# Patient Record
Sex: Female | Born: 2000 | Race: White | Hispanic: No | Marital: Single | Smoking: Current some day smoker
Health system: Southern US, Community
[De-identification: ages and names within clinical notes are randomized; demographics above are authoritative.]

## PROBLEM LIST (undated history)

## (undated) DIAGNOSIS — F1910 Other psychoactive substance abuse, uncomplicated: Secondary | ICD-10-CM

## (undated) DIAGNOSIS — J45909 Unspecified asthma, uncomplicated: Secondary | ICD-10-CM

## (undated) DIAGNOSIS — F603 Borderline personality disorder: Secondary | ICD-10-CM

---
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
## 2018-01-08 DIAGNOSIS — L700 Acne vulgaris: Secondary | ICD-10-CM | POA: Diagnosis not present

## 2018-01-11 DIAGNOSIS — X58XXXA Exposure to other specified factors, initial encounter: Secondary | ICD-10-CM | POA: Diagnosis not present

## 2018-01-11 DIAGNOSIS — S00551A Superficial foreign body of lip, initial encounter: Secondary | ICD-10-CM | POA: Diagnosis not present

## 2018-01-11 DIAGNOSIS — R220 Localized swelling, mass and lump, head: Secondary | ICD-10-CM | POA: Diagnosis not present

## 2018-01-11 DIAGNOSIS — Z23 Encounter for immunization: Secondary | ICD-10-CM | POA: Diagnosis not present

## 2018-01-29 MED FILL — PRAZOSIN 1 MG CAPSULE: 1 | 30 days supply | Qty: 30 | Fill #1

## 2018-02-24 MED FILL — traZODone HCL 50 MG TABS: 50 | 90 days supply | Qty: 90 | Fill #0

## 2018-03-06 DIAGNOSIS — J029 Acute pharyngitis, unspecified: Secondary | ICD-10-CM | POA: Diagnosis not present

## 2018-03-13 MED FILL — PRAZOSIN 1 MG CAPSULE: 1 | 30 days supply | Qty: 30 | Fill #2

## 2018-04-01 ENCOUNTER — Ambulatory Visit (HOSPITAL_COMMUNITY): Admission: RE | Admit: 2018-04-01 | Payer: Self-pay | Source: Home / Self Care | Admitting: Psychiatry

## 2018-04-01 ENCOUNTER — Inpatient Hospital Stay (HOSPITAL_COMMUNITY)
Admission: RE | Admit: 2018-04-01 | Discharge: 2018-04-04 | DRG: 885 | Disposition: A | Discharge status: Home / Self Care | Payer: No Typology Code available for payment source | Attending: Psychiatry | Admitting: Psychiatry

## 2018-04-01 ENCOUNTER — Encounter (HOSPITAL_COMMUNITY): Payer: Self-pay | Admitting: *Deleted

## 2018-04-01 ENCOUNTER — Other Ambulatory Visit: Payer: Self-pay

## 2018-04-01 DIAGNOSIS — J45909 Unspecified asthma, uncomplicated: Secondary | ICD-10-CM | POA: Diagnosis present

## 2018-04-01 DIAGNOSIS — F332 Major depressive disorder, recurrent severe without psychotic features: Secondary | ICD-10-CM | POA: Diagnosis not present

## 2018-04-01 DIAGNOSIS — F649 Gender identity disorder, unspecified: Secondary | ICD-10-CM | POA: Diagnosis present

## 2018-04-01 DIAGNOSIS — F1290 Cannabis use, unspecified, uncomplicated: Secondary | ICD-10-CM | POA: Diagnosis present

## 2018-04-01 DIAGNOSIS — G4700 Insomnia, unspecified: Secondary | ICD-10-CM | POA: Diagnosis present

## 2018-04-01 DIAGNOSIS — F17210 Nicotine dependence, cigarettes, uncomplicated: Secondary | ICD-10-CM

## 2018-04-01 DIAGNOSIS — Z23 Encounter for immunization: Secondary | ICD-10-CM | POA: Diagnosis not present

## 2018-04-01 DIAGNOSIS — Z62810 Personal history of physical and sexual abuse in childhood: Secondary | ICD-10-CM

## 2018-04-01 DIAGNOSIS — X789XXA Intentional self-harm by unspecified sharp object, initial encounter: Secondary | ICD-10-CM

## 2018-04-01 DIAGNOSIS — R45851 Suicidal ideations: Secondary | ICD-10-CM | POA: Diagnosis not present

## 2018-04-01 DIAGNOSIS — Z915 Personal history of self-harm: Secondary | ICD-10-CM | POA: Diagnosis not present

## 2018-04-01 HISTORY — DX: Unspecified asthma, uncomplicated: J45.909

## 2018-04-01 LAB — RAPID URINE DRUG SCREEN, HOSP PERFORMED
Amphetamines: NOT DETECTED
Barbiturates: NOT DETECTED
Benzodiazepines: NOT DETECTED
Cocaine: NOT DETECTED
Opiates: NOT DETECTED
Tetrahydrocannabinol: POSITIVE — AB

## 2018-04-01 LAB — PREGNANCY, URINE: Preg Test, Ur: NEGATIVE

## 2018-04-01 MED ORDER — ENSURE ENLIVE PO LIQD: 237.0000 mL | Freq: Every day | ORAL | Status: DC | PRN

## 2018-04-01 MED ORDER — ZIPRASIDONE HCL 40 MG PO CAPS
40.0000 mg | ORAL_CAPSULE | Freq: Two times a day (BID) | ORAL | Status: DC
  Administered 2018-04-01 – 2018-04-04 (×6): 40 mg via ORAL
  Filled 2018-04-01 (×6): qty 1
  Filled 2018-04-01: qty 2
  Filled 2018-04-01 (×4): qty 1

## 2018-04-01 MED ORDER — HYDROXYZINE HCL 25 MG PO TABS
25.0000 mg | ORAL_TABLET | Freq: Three times a day (TID) | ORAL | Status: DC | PRN
  Administered 2018-04-01 – 2018-04-03 (×4): 25 mg via ORAL
  Filled 2018-04-01 (×4): qty 1

## 2018-04-01 MED ORDER — TRAZODONE HCL 50 MG PO TABS: 50.0000 mg | ORAL_TABLET | Freq: Every evening | ORAL | Status: DC | PRN

## 2018-04-01 MED ORDER — PRAZOSIN HCL 1 MG PO CAPS
1.0000 mg | ORAL_CAPSULE | Freq: Every day | ORAL | Status: DC
  Administered 2018-04-01 – 2018-04-03 (×3): 1 mg via ORAL
  Filled 2018-04-01 (×6): qty 1

## 2018-04-01 MED ORDER — ADULT MULTIVITAMIN W/MINERALS CH
1.0000 | ORAL_TABLET | Freq: Every day | ORAL | Status: DC
  Administered 2018-04-01 – 2018-04-04 (×4): 1 via ORAL
  Filled 2018-04-01 (×7): qty 1

## 2018-04-01 MED ORDER — PAROXETINE HCL 20 MG PO TABS
20.0000 mg | ORAL_TABLET | Freq: Every day | ORAL | Status: DC
  Administered 2018-04-02 – 2018-04-04 (×3): 20 mg via ORAL
  Filled 2018-04-01 (×5): qty 1

## 2018-04-01 MED ORDER — NICOTINE 7 MG/24HR TD PT24
7.0000 mg | MEDICATED_PATCH | Freq: Every day | TRANSDERMAL | Status: DC
  Administered 2018-04-01 – 2018-04-04 (×4): 7 mg via TRANSDERMAL
  Filled 2018-04-01 (×6): qty 1

## 2018-04-01 MED ORDER — ENSURE ENLIVE PO LIQD: 237.0000 mL | Freq: Two times a day (BID) | ORAL | Status: DC

## 2018-04-01 MED ORDER — ALUM & MAG HYDROXIDE-SIMETH 200-200-20 MG/5ML PO SUSP: 30.0000 mL | ORAL | Status: DC | PRN

## 2018-04-01 MED ORDER — INFLUENZA VAC SPLIT QUAD 0.5 ML IM SUSY
0.5000 mL | PREFILLED_SYRINGE | INTRAMUSCULAR | Status: AC
  Administered 2018-04-02: 0.5 mL via INTRAMUSCULAR
  Filled 2018-04-01: qty 0.5

## 2018-04-01 MED ORDER — MAGNESIUM HYDROXIDE 400 MG/5ML PO SUSP: 30.0000 mL | Freq: Every day | ORAL | Status: DC | PRN

## 2018-04-01 MED ORDER — ACETAMINOPHEN 325 MG PO TABS
650.0000 mg | ORAL_TABLET | Freq: Four times a day (QID) | ORAL | Status: DC | PRN
  Administered 2018-04-01: 650 mg via ORAL
  Filled 2018-04-01: qty 2

## 2018-04-01 NOTE — H&P (Signed)
Behavioral Health Medical Screening Exam  Kristina Sawyer is an 17 y.o. female.  Total Time spent with patient: 20 minutes  Psychiatric Specialty Exam: Physical Exam  Constitutional: She is oriented to person, place, and time. She appears well-developed and well-nourished. No distress.  HENT:  Head: Normocephalic and atraumatic.  Right Ear: External ear normal.  Left Ear: External ear normal.  Eyes: Right eye exhibits no discharge. Left eye exhibits no discharge.  Respiratory: Effort normal. No respiratory distress.  Musculoskeletal: Normal range of motion.  Neurological: She is alert and oriented to person, place, and time.  Skin: Skin is warm and dry. She is not diaphoretic.       Review of Systems  Constitutional: Negative for chills, fever and weight loss.  Psychiatric/Behavioral: Positive for depression, hallucinations and suicidal ideas. Negative for memory loss and substance abuse. The patient is nervous/anxious and has insomnia.   All other systems reviewed and are negative.   There were no vitals taken for this visit.There is no height or weight on file to calculate BMI.  General Appearance: Casual and Well Groomed  Eye Contact:  Fair  Speech:  Clear and Coherent and Normal Rate  Volume:  Normal  Mood:  Anxious, Depressed, Dysphoric, Hopeless and Worthless  Affect:  Congruent and Depressed  Thought Process:  Coherent, Goal Directed and Descriptions of Associations: Intact  Orientation:  Full (Time, Place, and Person)  Thought Content:  Logical and Hallucinations: None  Suicidal Thoughts:  Yes.  with intent/plan  Homicidal Thoughts:  Yes.  without intent/plan  Memory:  Immediate;   Good Recent;   Fair  Judgement:  Impaired  Insight:  Fair  Psychomotor Activity:  Normal  Concentration: Concentration: Fair and Attention Span: Fair  Recall:  Good  Fund of Knowledge:Good  Language: Good  Akathisia:  No  Handed:  Right  AIMS (if indicated):     Assets:  Communication  Skills Desire for Improvement Financial Resources/Insurance Housing Intimacy Leisure Time Physical Health Transportation  Sleep:       Musculoskeletal: Strength & Muscle Tone: within normal limits Gait & Station: normal    Recommendations:  Based on my evaluation the patient does not appear to have an emergency medical condition.  Jackelyn PolingJason A Samaad Hashem, NP 04/01/2018, 2:39 AM
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
## 2018-04-01 NOTE — Plan of Care (Signed)
  Problem: Education: Goal: Emotional status will improve Outcome: Not Progressing   Problem: Activity: Goal: Interest or engagement in activities will improve Outcome: Not Progressing   Problem: Safety: Goal: Periods of time without injury will increase Outcome: Progressing  DAR NOTE: Patient presents with flat affect and depressed mood.  Denies suicidal thoughts, pain, auditory and visual hallucinations.  Described energy level as low and concentration as poor.  Rates depression at 0, hopelessness at 0, and anxiety at 0.  Maintained on routine safety checks.  Medications given as prescribed.  Support and encouragement offered as needed.  Attended group and participated.  Patient is withdrawn and isolates to her room for most of this shift.  Offered no complaint.

## 2018-04-01 NOTE — Progress Notes (Signed)
Adult Psychoeducational Group Note  Date:  04/01/2018 Time:  9:37 PM  Group Topic/Focus:  Wrap-Up Group:   The focus of this group is to help patients review their daily goal of treatment and discuss progress on daily workbooks.  Participation Level:  Did Not Attend  Participation Quality:  Did not attend  Affect:  Did not attend  Cognitive:  Did not attend  Insight: None  Engagement in Group:  Did not attend  Modes of Intervention:  Did not attend  Additional Comments:  Pt did not attend evening wrap up group tonight.  Kristina FurnaceChristopher  Lemond Sawyer 04/01/2018, 9:37 PM
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
## 2018-04-01 NOTE — Tx Team (Signed)
Initial Treatment Plan 04/01/2018 4:06 AM Kristina Sawyer [id number]    PATIENT STRESSORS: Marital or family conflict Traumatic event   PATIENT STRENGTHS: Active sense of humor Average or above average intelligence Communication skills General fund of knowledge Physical Health Supportive family/friends   PATIENT IDENTIFIED PROBLEMS: "be happier"  "develop healthier coping skills"    SI   depression             DISCHARGE CRITERIA:  Improved stabilization in mood, thinking, and/or behavior Need for constant or close observation no longer present  PRELIMINARY DISCHARGE PLAN: Outpatient therapy  PATIENT/FAMILY INVOLVEMENT: This treatment plan has been presented to and reviewed with the patient, Kristina Sawyer.  The patient and family have been given the opportunity to ask questions and make suggestions.  Arrie AranChurch, Librado Guandique J, [place]RN 04/01/2018, 4:06 AM

## 2018-04-01 NOTE — [facility] Suicide Risk Assessment (Signed)
[facility]   Nursing information obtained from:  Patient Demographic factors:  Gay, lesbian, or bisexual orientation Current Mental Status:  Self-harm behaviors Loss Factors:  NA Historical Factors:  Prior suicide attempts, Impulsivity, Family history of mental illness or substance abuse, Family history of suicide Risk Reduction Factors:  Religious beliefs about death, Sense of responsibility to family, Positive coping skills or problem solving skills, Living with another person, especially a relative  Total Time spent with patient: 45 minutes Principal Problem:  MDD Diagnosis:   Patient Active Problem List   Diagnosis Date Noted  . Severe recurrent major depression without psychotic features (HCC) [F33.2] 04/01/2018  . Borderline personality disorder (HCC) [F60.3]   . MDD (major depressive disorder), recurrent episode, severe (HCC) [F33.2] 10/07/2017  . MDD (major depressive disorder), severe (HCC) [F32.2] 05/21/2017  . MDD (major depressive disorder) [F32.9] 05/20/2017  . Depressed [F32.9] 02/15/2014  . Suicide attempt by drug ingestion (HCC) [T50.902A] 02/09/2014  . Depression [F32.9] 02/09/2014  . Anxiety state, unspecified [F41.1] 02/09/2014   Subjective Data:   Continued Clinical Symptoms:  Alcohol Use Disorder Identification Test Final Score (AUDIT): 2 The "Alcohol Use Disorders Identification Test", Guidelines for Use in Primary Care, Second Edition.  World Science writerHealth Organization [facility]i(WHO). Score between 0-7:  no or low risk or alcohol related problems. Score between 8-15:  moderate risk of alcohol related problems. Score between 16-19:  high risk of alcohol related problems. Score 20 or above:  warrants further diagnostic evaluation for alcohol dependence and treatment.   CLINICAL FACTORS:  17 year old , transgender female, reports worsening depression, anxiety, suicidal ideations, recent episode of self cutting .      Psychiatric Specialty  Exam: Physical Exam  ROS  Blood pressure 109/90, pulse 100, temperature 98.2 F (36.8 C), temperature source Oral, resp. rate 16, height 5\' 4"  (1.626 m), weight 80.7 kg, last menstrual period 03/01/2018.Body mass index is 30.55 kg/m.  See admit note MSE   COGNITIVE FEATURES THAT CONTRIBUTE TO RISK:  Closed-mindedness and Loss of executive function    SUICIDE RISK:   Moderate:  Frequent suicidal ideation with limited intensity, and duration, some specificity in terms of plans, no associated intent, good self-control, limited dysphoria/symptomatology, some risk factors present, and identifiable protective factors, including available and accessible social support.  PLAN OF CARE: Patient will be admitted to inpatient psychiatric unit for stabilization and safety. Will provide and encourage milieu participation. Provide medication management and maked adjustments as needed.  Will follow daily.    I certify that inpatient services furnished can reasonably be expected to improve the patient's condition.   Craige CottaFernando A Cobos, MD 04/01/2018, 3:07 PM

## 2018-04-01 NOTE — [facility] Group Notes (Signed)
LCSW Group Therapy Note 04/01/2018 12:07 PM  Type of Therapy/Topic: Group Therapy: Feelings about Diagnosis  Participation Level: Active   Description of Group:  This group will allow patients to explore their thoughts and feelings about diagnoses they have received. Patients will be guided to explore their level of understanding and acceptance of these diagnoses. Facilitator will encourage patients to process their thoughts and feelings about the reactions of others to their diagnosis and will guide patients in identifying ways to discuss their diagnosis with significant others in their lives. This group will be process-oriented, with patients participating in exploration of their own experiences, giving and receiving support, and processing challenge from other group members.  Therapeutic Goals: 1. Patient will demonstrate understanding of diagnosis as evidenced by identifying two or more symptoms of the disorder 2. Patient will be able to express two feelings regarding the diagnosis 3. Patient will demonstrate their ability to communicate their needs through discussion and/or role play  Summary of Patient Progress:  Kristina Sawyer was engaged and participated throughout the group session. Kristina Sawyer reports that having a mental health diagnosis is "frustrating". Kristina Sawyer states that even though she may comply with her medications and engage in treatment, she still suffers from symptoms.     Therapeutic Modalities:  Cognitive Behavioral Therapy Brief Therapy Feelings Identification    Heidi Lemay Catalina AntiguaWilliams LCSWA Clinical Social Worker
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
## 2018-04-01 NOTE — BHH Group Notes (Signed)
Adult Psychoeducational Group Note  Date:  04/01/2018  Time:  4:00 PM  Group Topic/Focus: Therapeutic Relaxtion  Participation Level:  Did Not Attend  Modes of Intervention:  Discussion and Education  Additional Comments:  Patient was invited but declined to attend group.  Katelyne Galster A Lucciano Vitali 04/01/2018 5:00 PM 
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
## 2018-04-01 NOTE — [facility] Suicide Risk Assessment (Signed)
[facility] INPATIENT:  Family/Significant Other Suicide Prevention Education  Suicide Prevention Education:  Contact Attempts: Kristina Sawyer, mother, [phone], has been identified by the patient as the family member/significant other with whom the patient will be residing, and identified as the person(s) who will aid the patient in the event of a mental health crisis.  With written consent from the patient, two attempts were made to provide suicide prevention education, prior to and/or following the patient's discharge.  We were unsuccessful in providing suicide prevention education.  A suicide education pamphlet was given to the patient to share with family/significant other.  Date and time of first attempt:04/01/18, 1519 Date and time of second attempt:  Kristina Sawyer, Kristina Losasso Jon, LCSW 04/01/2018, 3:19 PM

## 2018-04-02 LAB — CBC
HCT: 45 % (ref 36.0–46.0)
Hemoglobin: 15.3 g/dL — ABNORMAL HIGH (ref 12.0–15.0)
MCH: 32 pg (ref 26.0–34.0)
MCHC: 34 g/dL (ref 30.0–36.0)
MCV: 94.1 fL (ref 78.0–100.0)
Platelets: 267 10*3/uL (ref 150–400)
RBC: 4.78 MIL/uL (ref 3.87–5.11)
RDW: 12.9 % (ref 11.5–15.5)
WBC: 6.5 10*3/uL (ref 4.0–10.5)

## 2018-04-02 LAB — COMPREHENSIVE METABOLIC PANEL
ALT: 25 U/L (ref 0–44)
AST: 23 U/L (ref 15–41)
Albumin: 3.9 g/dL (ref 3.5–5.0)
Alkaline Phosphatase: 74 U/L (ref 38–126)
Anion gap: 10 (ref 5–15)
BUN: 10 mg/dL (ref 6–20)
CO2: 23 mmol/L (ref 22–32)
Calcium: 9.3 mg/dL (ref 8.9–10.3)
Chloride: 109 mmol/L (ref 98–111)
Creatinine, Ser: 0.67 mg/dL (ref 0.44–1.00)
GFR calc Af Amer: >60 mL/min (ref >60)
GFR calc non Af Amer: >60 mL/min (ref >60)
Glucose, Bld: 95 mg/dL (ref 70–99)
Potassium: 4.1 mmol/L (ref 3.5–5.1)
Sodium: 142 mmol/L (ref 135–145)
Total Bilirubin: 0.6 mg/dL (ref 0.3–1.2)
Total Protein: 7.2 g/dL (ref 6.5–8.1)

## 2018-04-02 LAB — TSH: TSH: 3.254 u[IU]/mL (ref 0.350–4.500)

## 2018-04-02 LAB — LIPID PANEL
Cholesterol: 138 mg/dL (ref 0–200)
HDL: 38 mg/dL — ABNORMAL LOW (ref >40)
LDL Cholesterol: 81 mg/dL (ref 0–99)
Total CHOL/HDL Ratio: 3.6 RATIO
Triglycerides: 96 mg/dL (ref <150)
VLDL: 19 mg/dL (ref 0–40)

## 2018-04-02 NOTE — [facility] Group Notes (Signed)
[facility]      04/02/2018 2:50 PM  Type of Therapy: Mental Health Association Presentation  Participation Level: Active  Participation Quality: Attentive  Affect: Appropriate  Cognitive: Oriented  Insight: Developing/Improving  Engagement in Therapy: Engaged  Modes of Intervention: Discussion, Education and Socialization  Summary of Progress/Problems: Mental Health Association (MHA) Speaker came to talk about his personal journey with mental health. The pt processed ways by which to relate to the speaker. MHA speaker provided handouts and educational information pertaining to groups and services offered by [facility]. Pt was engaged in speaker's presentation and was receptive to resources provided.    Alcario DroughtJolan Leler Brion LCSWA Clinical Social Worker

## 2018-04-02 NOTE — Plan of Care (Signed)
  Problem: Activity: Goal: Interest or engagement in activities will improve Outcome: Not Progressing   Problem: Safety: Goal: Periods of time without injury will increase Outcome: Progressing  DAR NOTE: Patient presents with anxious affect and depressed mood.  Denies suicidal thoughts, pain, auditory and visual hallucinations.  Described energy level as normal and concentration as good.  Patient is withdrawn and isolates to his room.  Encouraged to be visible in milieu.  Rates depression at 1, hopelessness at 1, and anxiety at 3.  Maintained on routine safety checks.  Medications given as prescribed.  Support and encouragement offered as needed.  Attended group and participated.  States goal for today is "discharge plan."  Patient is safe on and off the unit.  Offered no complaint.

## 2018-04-02 NOTE — Progress Notes (Signed)
Recreation Therapy Notes  Date: 9.18.19 Time: 0930 Location: 300 [address] Dayroom  Group Topic: Stress Management  Goal Area(s) Addresses:  Patient will verbalize importance of using healthy stress management.  Patient will identify positive emotions associated with healthy stress management.   Intervention: Stress Management  Activity :  Meditation.  LRT introduced the stress management technique of meditation to patients.  LRT played a meditation on choice for patients.  Patients were to follow along as meditation played to engage in the activity.  Education:  Stress Management, Discharge Planning.   Education Outcome: Acknowledges edcuation/In group clarification offered/Needs additional education  Clinical Observations/Feedback: Pt did not attend group.    Kristina Sawyer, LRT/CTRS         Kristina Sawyer A 04/02/2018 10:59 AM 

## 2018-04-02 NOTE — Progress Notes (Signed)
D:  Kristina Sawyer has been in his room all shift.  He did come up to get his hs medication as well as snack.  He was minimal with his interaction.  He denied SI/HI or A/V hallucinations.  He did report coming out of his room for meals but not attending groups.  He denied any pain or discomfort and appeared to be in no physical distress.  He took vistaril for anxiety at bedtime.  He is currently resting with his eyes closed and appears to be asleep. A:  1:1 with RN for support and encouragement.  Medications as ordered.  Q 15 minute checks maintained for safety.  Encouraged participation in group and unit activities.   R:  Kristina Sawyer remains safe on the unit.  We will continue to monitor the progress towards his goals.

## 2018-04-02 NOTE — Progress Notes (Addendum)
D:  Kristina Sawyer has been in his room all evening.  He did not get up to attend evening wrap up group.  He denied SI/HI or A/V hallucinations.  She does report "a little anxiety."  Sitting vital signs were stable but her pulse elevated upon standing.  Her pulse was 88 while resting via radial pulse.  He reported drinking more fluids today and encouraged him to continue drinking fluids.  He took her hs medications without difficulty.  Kristina Sawyer signed the 72 hour request for discharge form at 1910 today. A:  1:1 with RN for support and encouragement.  Medications as ordered.  Q 15 minute checks maintained for safety.  Encouraged participation in group and unit activities.   R:  Kristina Sawyer remains safe on the unit.  We will continue to monitor the progress towards his goals.

## 2018-04-02 NOTE — Plan of Care (Signed)
  Problem: Self-Concept: Goal: Ability to disclose and discuss suicidal ideas will improve Note:  He currently denied SI/HI this evening.

## 2018-04-02 NOTE — [facility] Group Notes (Signed)
[facility] Group Notes:  (Nursing/Personal Developement)  Date:  04/02/2018  Time:  1330 Type of Therapy:  Nurse Education  Participation Level:  Did Not Attend  Summary of Progress/Problems:  Pt did not attend group despite multiple prompts.     Kristina Sawyer, Kristina Sawyer 04/02/2018, 7:36 PM

## 2018-04-02 NOTE — Tx Team (Signed)
Interdisciplinary Treatment and Diagnostic Plan Update  04/02/2018 Time of Session: 10:30am Kristina Sawyer MRN: [id number]  Principal Diagnosis: <principal problem not specified>  Secondary Diagnoses: Active Problems:   Severe recurrent major depression without psychotic features (HCC)   Current Medications:  Current Facility-Administered Medications  Medication Dose Route Frequency Provider Last Rate Last Dose  . acetaminophen (TYLENOL) tablet 650 mg  650 mg Oral Q6H PRN Lindon Romp A, NP   650 mg at 04/01/18 0345  . alum & mag hydroxide-simeth (MAALOX/MYLANTA) 200-200-20 MG/5ML suspension 30 mL  30 mL Oral Q4H PRN Lindon Romp A, NP      . feeding supplement (ENSURE ENLIVE) (ENSURE ENLIVE) liquid 237 mL  237 mL Oral Daily PRN Hampton Abbot, MD      . hydrOXYzine (ATARAX/VISTARIL) tablet 25 mg  25 mg Oral TID PRN Rozetta Nunnery, NP   25 mg at 04/01/18 2158  . magnesium hydroxide (MILK OF MAGNESIA) suspension 30 mL  30 mL Oral Daily PRN Lindon Romp A, NP      . multivitamin with minerals tablet 1 tablet  1 tablet Oral Daily Hampton Abbot, MD   1 tablet at 04/02/18 0818  . nicotine (NICODERM CQ - dosed in mg/24 hr) patch 7 mg  7 mg Transdermal Daily Sharma Covert, MD   7 mg at 04/02/18 0836  . PARoxetine (PAXIL) tablet 20 mg  20 mg Oral Daily Cobos, Myer Peer, MD   20 mg at 04/02/18 0818  . prazosin (MINIPRESS) capsule 1 mg  1 mg Oral QHS Cobos, Myer Peer, MD   1 mg at 04/01/18 2158  . ziprasidone (GEODON) capsule 40 mg  40 mg Oral BID WC Cobos, Myer Peer, MD   40 mg at 04/02/18 0818   PTA Medications: Medications Prior to Admission  Medication Sig Dispense Refill Last Dose  . albuterol (PROVENTIL HFA;VENTOLIN HFA) 108 (90 Base) MCG/ACT inhaler Inhale 2 puffs every 6 (six) hours as needed into the lungs for wheezing or shortness of breath. 1 Inhaler 0   . hydrOXYzine (ATARAX/VISTARIL) 25 MG tablet Take 1 tablet (25 mg total) by mouth every 6 (six) hours as needed for anxiety. 30  tablet 0   . minocycline (MINOCIN,DYNACIN) 100 MG capsule Take 100 mg daily by mouth. For acne   05/19/2017 at Unknown time  . PARoxetine (PAXIL) 40 MG tablet Take 1 tablet (40 mg total) by mouth daily. 30 tablet 0   . prazosin (MINIPRESS) 1 MG capsule Take 1 capsule (1 mg total) by mouth at bedtime. 30 capsule 0   . propranolol (INDERAL) 10 MG tablet Take 1 tablet (10 mg total) by mouth 3 (three) times daily. 60 tablet 0   . traZODone (DESYREL) 50 MG tablet Take 1 tablet (50 mg total) by mouth at bedtime as needed for sleep. 30 tablet 0   . ziprasidone (GEODON) 40 MG capsule Take 1 capsule (40 mg total) by mouth 2 (two) times daily with a meal. 60 capsule 0     Patient Stressors: Marital or family conflict Traumatic event  Patient Strengths: Active sense of humor Average or above average intelligence Communication skills General fund of knowledge Physical Health Supportive family/friends  Treatment Modalities: Medication Management, Group therapy, Case management,  1 to 1 session with clinician, Psychoeducation, Recreational therapy.   Physician Treatment Plan for Primary Diagnosis: <principal problem not specified> Long Term Goal(s): Improvement in symptoms so as ready for discharge Improvement in symptoms so as ready for discharge   Short Term Goals:  Ability to identify changes in lifestyle to reduce recurrence of condition will improve Ability to maintain clinical measurements within normal limits will improve Ability to identify changes in lifestyle to reduce recurrence of condition will improve Ability to verbalize feelings will improve Ability to disclose and discuss suicidal ideas Ability to demonstrate self-control will improve Ability to identify and develop effective coping behaviors will improve Ability to maintain clinical measurements within normal limits will improve  Medication Management: Evaluate patient's response, side effects, and tolerance of medication  regimen.  Therapeutic Interventions: 1 to 1 sessions, Unit Group sessions and Medication administration.  Evaluation of Outcomes: Not Met  Physician Treatment Plan for Secondary Diagnosis: Active Problems:   Severe recurrent major depression without psychotic features ([place])  Long Term Goal(s): Improvement in symptoms so as ready for discharge Improvement in symptoms so as ready for discharge   Short Term Goals: Ability to identify changes in lifestyle to reduce recurrence of condition will improve Ability to maintain clinical measurements within normal limits will improve Ability to identify changes in lifestyle to reduce recurrence of condition will improve Ability to verbalize feelings will improve Ability to disclose and discuss suicidal ideas Ability to demonstrate self-control will improve Ability to identify and develop effective coping behaviors will improve Ability to maintain clinical measurements within normal limits will improve     Medication Management: Evaluate patient's response, side effects, and tolerance of medication regimen.  Therapeutic Interventions: 1 to 1 sessions, Unit Group sessions and Medication administration.  Evaluation of Outcomes: Not Met   RN Treatment Plan for Primary Diagnosis: <principal problem not specified> Long Term Goal(s): Knowledge of disease and therapeutic regimen to maintain health will improve  Short Term Goals: Ability to verbalize feelings will improve, Ability to disclose and discuss suicidal ideas, Ability to identify and develop effective coping behaviors will improve and Compliance with prescribed medications will improve  Medication Management: RN will administer medications as ordered by provider, will assess and evaluate patient's response and provide education to patient for prescribed medication. RN will report any adverse and/or side effects to prescribing provider.  Therapeutic Interventions: 1 on 1 counseling sessions,  Psychoeducation, Medication administration, Evaluate responses to treatment, Monitor vital signs and CBGs as ordered, Perform/monitor CIWA, COWS, AIMS and Fall Risk screenings as ordered, Perform wound care treatments as ordered.  Evaluation of Outcomes: Not Met   LCSW Treatment Plan for Primary Diagnosis: <principal problem not specified> Long Term Goal(s): Safe transition to appropriate next level of care at discharge, Engage patient in therapeutic group addressing interpersonal concerns.  Short Term Goals: Engage patient in aftercare planning with referrals and resources  Therapeutic Interventions: Assess for all discharge needs, 1 to 1 time with Social worker, Explore available resources and support systems, Assess for adequacy in community support network, Educate family and significant other(s) on suicide prevention, Complete Psychosocial Assessment, Interpersonal group therapy.  Evaluation of Outcomes: Not Met   Progress in Treatment: Attending groups: Yes. Participating in groups: Yes. Taking medication as prescribed: Yes. Toleration medication: Yes. Family/Significant other contact made: No, will contact:  the patient's mother Patient understands diagnosis: Yes. Discussing patient identified problems/goals with staff: Yes. Medical problems stabilized or resolved: Yes. Denies suicidal/homicidal ideation: Yes. Issues/concerns per patient self-inventory: No. Other:   New problem(s) identified: None  New Short Term/Long Term Goal(s):medication stabilization, elimination of SI thoughts, development of comprehensive mental wellness plan.    Patient Goals:  Be happier and develop healthier coping skills  Discharge Plan or Barriers: CSW will assess for  appropriate referrals and discharge planning.   Reason for Continuation of Hospitalization: Depression Medication stabilization Suicidal ideation  Estimated Length of Stay: 3-5 days   Attendees: Patient: 04/02/2018 2:26 PM   Physician: Dr. Neita Garnet, MD 04/02/2018 2:26 PM  Nursing: Benjamine Mola, RN 04/02/2018 2:26 PM  RN Care Manager: Rhunette Croft 04/02/2018 2:26 PM  Social Worker: Radonna Ricker, [place] 04/02/2018 2:26 PM  Recreational Therapist: Rhunette Croft 04/02/2018 2:26 PM  Other: Rhunette Croft 04/02/2018 2:26 PM  Other: X 04/02/2018 2:26 PM  Other:X 04/02/2018 2:26 PM    Scribe for Treatment Team: Marylee Floras, [place] 04/02/2018 2:26 PM
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
## 2018-04-02 NOTE — Plan of Care (Signed)
  Problem: Activity: Goal: Interest or engagement in leisure activities will improve Outcome: Not Progressing Note:  Pt chooses to stay in bed and not attend groups.

## 2018-04-03 LAB — HEMOGLOBIN A1C
Hgb A1c MFr Bld: 4.8 % (ref 4.8–5.6)
Mean Plasma Glucose: 91 mg/dL

## 2018-04-03 LAB — PROLACTIN: Prolactin: 20 ng/mL (ref 4.8–23.3)

## 2018-04-03 NOTE — Progress Notes (Signed)
[facility] Group Notes:  (Nursing/MHT/Case Management/Adjunct)  Date:  04/03/2018  Time: 4:00 PM  Type of Therapy:  Nurse Education  Participation Level:  Active  Participation Quality:  Appropriate and Supportive  Affect:  Appropriate  Cognitive:  Alert and Appropriate  Insight:  Appropriate  Engagement in Group:  Developing/Improving and Engaged  Modes of Intervention:  Discussion and Education  Summary of Progress/Problems: Patient is progressing in plan of treatment.  Dewayne Shorterlyssa  Anastasio Wogan 04/03/2018, 5:33 PM

## 2018-04-03 NOTE — Progress Notes (Signed)
Pt did not attend wrap-up group   

## 2018-04-03 NOTE — [facility] Group Notes (Signed)
[facility] LCSW Group Therapy Note  Date/Time: 04/03/18, 1315  Type of Therapy/Topic:  Group Therapy:  Balance in Life  Participation Level:  moderate  Description of Group:    This group will address the concept of balance and how it feels and looks when one is unbalanced. Patients will be encouraged to process areas in their lives that are out of balance, and identify reasons for remaining unbalanced. Facilitators will guide patients utilizing problem- solving interventions to address and correct the stressor making their life unbalanced. Understanding and applying boundaries will be explored and addressed for obtaining  and maintaining a balanced life. Patients will be encouraged to explore ways to assertively make their unbalanced needs known to significant others in their lives, using other group members and facilitator for support and feedback.  Therapeutic Goals: 1. Patient will identify two or more emotions or situations they have that consume much of in their lives. 2. Patient will identify signs/triggers that life has become out of balance:  3. Patient will identify two ways to set boundaries in order to achieve balance in their lives:  4. Patient will demonstrate ability to communicate their needs through discussion and/or role plays  Summary of Patient Progress:Pt identified work, and financial as areas that are out of balance in her life.  Pt made several comments during group discussion about how to set boundaries and otherwise respond to areas that have become out of balance.           Therapeutic Modalities:   Cognitive Behavioral Therapy Solution-Focused Therapy Assertiveness Training  Daleen SquibbGreg Kyce Ging, [place]CS[place]
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
## 2018-04-03 NOTE — Progress Notes (Signed)
[facility] MD Progress Note  04/03/2018 6:36 PM Kristina Sawyer  MRN:  [id number] Subjective: Reports feeling significantly better than on admission and currently minimizes depression states "I feel a lot better".  Does not endorse neurovegetative symptoms at present and states sleep/appetite/energy level improved.  Denies suicidal ideations and is presently future oriented, hoping for discharge soon. Denies medication side effects. Objective: I have discussed case with treatment team and have met with patient 17 year old, transgender female, presented due to worsening depression, anxiety, suicidal ideations, principal, self cutting, with multiple superficial lacerations visible on forearm . Reports significant improvement compared to admission, describes mood as improved and currently minimizes depression or neurovegetative symptoms of depression.  Does present with a fuller/brighter range of affect. Has been more visible on unit, going to some groups.  No disruptive or agitated behaviors. Denies suicidal ideations. Denies medication side effects. Problem: MDD  Diagnosis:   Patient Active Problem List   Diagnosis Date Noted  . Severe recurrent major depression without psychotic features ([place]) [F33.2] 04/01/2018  . Borderline personality disorder ([place]) [F60.3]   . MDD (major depressive disorder), recurrent episode, severe ([place]) [F33.2] 10/07/2017  . MDD (major depressive disorder), severe ([place]) [F32.2] 05/21/2017  . MDD (major depressive disorder) [F32.9] 05/20/2017  . Depressed [F32.9] 02/15/2014  . Suicide attempt by drug ingestion ([place]) [T50.902A] 02/09/2014  . Depression [F32.9] 02/09/2014  . Anxiety state, unspecified [F41.1] 02/09/2014   Total Time spent with patient: 15 minutes  Past Psychiatric History:   Past Medical History:  Past Medical History:  Diagnosis Date  . Asthma    History reviewed. No pertinent surgical history. Family History: History reviewed. No pertinent family  history. Family Psychiatric  History:  Social History:  Social History   Substance and Sexual Activity  Alcohol Use Not Currently     Social History   Substance and Sexual Activity  Drug Use Yes  . Types: Marijuana   Comment: occ    Social History   Socioeconomic History  . Marital status: Single    Spouse name: Not on file  . Number of children: Not on file  . Years of education: Not on file  . Highest education level: Not on file  Occupational History  . Not on file  Social Needs  . Financial resource strain: Not on file  . Food insecurity:    Worry: Not on file    Inability: Not on file  . Transportation needs:    Medical: Not on file    Non-medical: Not on file  Tobacco Use  . Smoking status: Current Some Day Smoker    Types: Cigarettes  . Smokeless tobacco: Never Used  Substance and Sexual Activity  . Alcohol use: Not Currently  . Drug use: Yes    Types: Marijuana    Comment: occ  . Sexual activity: Not Currently  Lifestyle  . Physical activity:    Days per week: Not on file    Minutes per session: Not on file  . Stress: Not on file  Relationships  . Social connections:    Talks on phone: Not on file    Gets together: Not on file    Attends religious service: Not on file    Active member of club or organization: Not on file    Attends meetings of clubs or organizations: Not on file    Relationship status: Not on file  Other Topics Concern  . Not on file  Social History Narrative  . Not on file  Additional Social History:    Pain Medications: denies Prescriptions: denies Over the Counter: denies History of alcohol / drug use?: Yes Longest period of sobriety (when/how long): unknown Negative Consequences of Use: Financial Name of Substance 1: marijuana 1 - Age of First Use: 13 1 - Amount (size/oz): varies 1 - Frequency: daily 1 - Duration: on-going 1 - Last Use / Amount: 04/01/18 Name of Substance 2: Acid 2 - Age of First Use: 10 2 -  Amount (size/oz): "A few tabs" 2 - Frequency: 2x/year 2 - Duration: Unknown 2 - Last Use / Amount: December 2018  Sleep: Improving  Appetite:  Improving  Current Medications: Current Facility-Administered Medications  Medication Dose Route Frequency Provider Last Rate Last Dose  . acetaminophen (TYLENOL) tablet 650 mg  650 mg Oral Q6H PRN Lindon Romp A, NP   650 mg at 04/01/18 0345  . alum & mag hydroxide-simeth (MAALOX/MYLANTA) 200-200-20 MG/5ML suspension 30 mL  30 mL Oral Q4H PRN Lindon Romp A, NP      . feeding supplement (ENSURE ENLIVE) (ENSURE ENLIVE) liquid 237 mL  237 mL Oral Daily PRN Hampton Abbot, MD      . hydrOXYzine (ATARAX/VISTARIL) tablet 25 mg  25 mg Oral TID PRN Rozetta Nunnery, NP   25 mg at 04/02/18 2136  . magnesium hydroxide (MILK OF MAGNESIA) suspension 30 mL  30 mL Oral Daily PRN Lindon Romp A, NP      . multivitamin with minerals tablet 1 tablet  1 tablet Oral Daily Hampton Abbot, MD   1 tablet at 04/03/18 0803  . nicotine (NICODERM CQ - dosed in mg/24 hr) patch 7 mg  7 mg Transdermal Daily Sharma Covert, MD   7 mg at 04/03/18 0809  . PARoxetine (PAXIL) tablet 20 mg  20 mg Oral Daily Timur Nibert, Myer Peer, MD   20 mg at 04/03/18 0803  . prazosin (MINIPRESS) capsule 1 mg  1 mg Oral QHS Nitza Schmid, Myer Peer, MD   1 mg at 04/02/18 2136  . ziprasidone (GEODON) capsule 40 mg  40 mg Oral BID WC Alizea Pell, Myer Peer, MD   40 mg at 04/03/18 1704    Lab Results:  Results for orders placed or performed during the hospital encounter of 04/01/18 (from the past 48 hour(s))  CBC     Status: Abnormal   Collection Time: 04/02/18  6:53 AM  Result Value Ref Range   WBC 6.5 4.0 - 10.5 K/uL   RBC 4.78 3.87 - 5.11 MIL/uL   Hemoglobin 15.3 (H) 12.0 - 15.0 g/dL   HCT 45.0 36.0 - 46.0 %   MCV 94.1 78.0 - 100.0 fL   MCH 32.0 26.0 - 34.0 pg   MCHC 34.0 30.0 - 36.0 g/dL   RDW 12.9 11.5 - 15.5 %   Platelets 267 150 - 400 K/uL    Comment: Performed at [facility],  [place] [address], [place], [place] [address]  Comprehensive metabolic panel     Status: None   Collection Time: 04/02/18  6:53 AM  Result Value Ref Range   Sodium 142 135 - 145 mmol/L   Potassium 4.1 3.5 - 5.1 mmol/L   Chloride 109 98 - 111 mmol/L   CO2 23 22 - 32 mmol/L   Glucose, Bld 95 70 - 99 mg/dL   BUN 10 6 - 20 mg/dL   Creatinine, Ser 0.67 0.44 - 1.00 mg/dL   Calcium 9.3 8.9 - 10.3 mg/dL   Total Protein 7.2 6.5 -  8.1 g/dL   Albumin 3.9 3.5 - 5.0 g/dL   AST 23 15 - 41 U/L   ALT 25 0 - 44 U/L   Alkaline Phosphatase 74 38 - 126 U/L   Total Bilirubin 0.6 0.3 - 1.2 mg/dL   GFR calc non Af Amer >60 >60 mL/min   GFR calc Af Amer >60 >60 mL/min    Comment: (NOTE) The eGFR has been calculated using the CKD EPI equation. This calculation has not been validated in all clinical situations. eGFR's persistently <60 mL/min signify possible Chronic Kidney Disease.    Anion gap 10 5 - 15    Comment: Performed at [facility], [place] [address] [place], [place] [address]  Hemoglobin A1c     Status: None   Collection Time: 04/02/18  6:53 AM  Result Value Ref Range   Hgb A1c MFr Bld 4.8 4.8 - 5.6 %    Comment: (NOTE)         Prediabetes: 5.7 - 6.4         Diabetes: >6.4         Glycemic control for adults with diabetes: <7.0    Mean Plasma Glucose 91 mg/dL    Comment: (NOTE) Performed At: [facility] [place], [place] [id number] Rush Farmer MD HX:[id number]   Lipid panel     Status: Abnormal   Collection Time: 04/02/18  6:53 AM  Result Value Ref Range   Cholesterol 138 0 - 200 mg/dL   Triglycerides 96 <150 mg/dL   HDL 38 (L) >40 mg/dL   Total CHOL/HDL Ratio 3.6 RATIO   VLDL 19 0 - 40 mg/dL   LDL Cholesterol 81 0 - 99 mg/dL    Comment:        Total Cholesterol/HDL:CHD Risk Coronary Heart Disease Risk Table                     Men   Women  1/2 Average Risk   3.4   3.3  Average Risk       5.0   4.4  2 X Average Risk   9.6   7.1  3 X  Average Risk  23.4   11.0        Use the calculated Patient Ratio above and the CHD Risk Table to determine the patient's CHD Risk.        ATP III CLASSIFICATION (LDL):  <100     mg/dL   Optimal  100-129  mg/dL   Near or Above                    Optimal  130-159  mg/dL   Borderline  160-189  mg/dL   High  >190     mg/dL   Very High Performed at [facility] [address] [place], [place] [address]   Prolactin     Status: None   Collection Time: 04/02/18  6:53 AM  Result Value Ref Range   Prolactin 20.0 4.8 - 23.3 ng/mL    Comment: (NOTE) Performed At: [facility] [place], [place] [id number] Rush Farmer MD LJ:[id number]   TSH     Status: None   Collection Time: 04/02/18  6:53 AM  Result Value Ref Range   TSH 3.254 0.350 - 4.500 uIU/mL    Comment: Performed by a 3rd Generation assay with a functional sensitivity of <=0.01 uIU/mL. Performed at [facility], [place]  [address] [place], [place] [address]     Blood Alcohol level:  Lab Results  Component Value Date   [facility] <10 10/08/2017   ETH <11 03/88/8280    Metabolic Disorder Labs: Lab Results  Component Value Date   HGBA1C 4.8 04/02/2018   MPG 91 04/02/2018   Lab Results  Component Value Date   PROLACTIN 20.0 04/02/2018   Lab Results  Component Value Date   CHOL 138 04/02/2018   TRIG 96 04/02/2018   HDL 38 (L) 04/02/2018   CHOLHDL 3.6 04/02/2018   VLDL 19 04/02/2018   LDLCALC 81 04/02/2018   LDLCALC 64 05/21/2017    Physical Findings: AIMS: Facial and Oral Movements Muscles of Facial Expression: None, normal Lips and Perioral Area: None, normal Jaw: None, normal Tongue: None, normal,Extremity Movements Upper (arms, wrists, hands, fingers): None, normal Lower (legs, knees, ankles, toes): None, normal, Trunk Movements Neck, shoulders, hips: None, normal, Overall Severity Severity of abnormal movements (highest score from questions above):  None, normal Incapacitation due to abnormal movements: None, normal Patient's awareness of abnormal movements (rate only patient's report): No Awareness, Dental Status Current problems with teeth and/or dentures?: No Does patient usually wear dentures?: No  CIWA:  CIWA-Ar Total: 1 COWS:  COWS Total Score: 3  Musculoskeletal: Strength & Muscle Tone: within normal limits Gait & Station: normal Patient leans: N/A  Psychiatric Specialty Exam: Physical Exam  ROS reports mild lightheadedness, no chest pain, no shortness of breath, no nausea, no vomiting, no rash  Blood pressure 117/84, pulse (!) 104, temperature 98.7 F (37.1 C), temperature source Oral, resp. rate 18, height _0  (1.626 m), weight 80.7 kg, last menstrual period 03/01/2018.Body mass index is 30.55 kg/m.  General Appearance: Improving grooming  Eye Contact:  Good  Speech:  Normal Rate  Volume:  Normal  Mood:  Describes mood as "a lot better"  Affect:  More reactive, fuller in range  Thought Process:  Linear and Descriptions of Associations: Intact  Orientation:  Other:  Fully alert and attentive  Thought Content:  Denies hallucinations, no delusions, not internally preoccupied  Suicidal Thoughts:  No-currently denies suicidal ideations or self-injurious ideations, contracts for safety, denies homicidal ideations  Homicidal Thoughts:  No  Memory:  Recent and remote grossly intact  Judgement: Improving  Insight:  Fair/improving  Psychomotor Activity:  Normal  Concentration:  Concentration: Good and Attention Span: Good  Recall:  Good  Fund of Knowledge:  Good  Language:  Good  Akathisia:  Negative  Handed:  Right  AIMS (if indicated):     Assets:  Desire for Improvement Resilience  ADL's:  Intact  Cognition:  WNL  Sleep:  Number of Hours: 6.25   Assessment-17 year old , transgender female, reports worsening depression, anxiety, suicidal ideations, recent episode of self cutting .  Patient reports feeling "a lot  better", currently minimizes depression or significant neuro vegetative symptoms.  Does present with a fuller/brighter affect, has been more visible on unit/more interactive with peers.  Currently denies medication side effects.  Future oriented and hopeful for discharge soon.  Treatment Plan Summary: Daily contact with patient to assess and evaluate symptoms and progress in treatment, Medication management, Plan Inpatient treatment and Medications as below  Treatment plan reviewed as below today 9/19 Encourage group/milieu participation to work on coping skills and symptom reduction Continue Minipress 1 mg nightly for nightmares Continue Geodon 40 mg twice daily for mood disorder Continue Paxil 20 mg daily for depression and anxiety Continue Vistaril 25 mg every  8 hours PRN for anxiety Monitor for orthostatic vital sign changes Treatment team working on disposition planning Jenne Campus, MD 04/03/2018, 6:36 PM   Patient ID: Kristina Sawyer, female   DOB: 12-05-2000, 17 y.o.   MRN: [id number]

## 2018-04-03 NOTE — Plan of Care (Signed)
  Problem: Education: Goal: Emotional status will improve Outcome: Progressing   Problem: Activity: Goal: Interest or engagement in activities will improve Outcome: Progressing   Problem: Safety: Goal: Periods of time without injury will increase Outcome: Progressing  DAR NOTE: Patient presents with calm affect and pleasant mood.  Denies suicidal thoughts, pain, auditory and visual hallucinations.  Described energy level as normal and concentration as good.  Rates depression at 0, hopelessness at 0, and anxiety at 0.  Maintained on routine safety checks.  Medications given as prescribed.  Support and encouragement offered as needed.  Attended group and participated.  States goal for today is develop discharge plan."  Patient visible in milieu with minimal interactions.  Verbalizes readiness for discharge.

## 2018-04-04 MED ORDER — PAROXETINE HCL 20 MG PO TABS: 20.0000 mg | ORAL_TABLET | Freq: Every day | ORAL | 0 refills | Status: AC

## 2018-04-04 MED ORDER — HYDROXYZINE HCL 25 MG PO TABS: 25.0000 mg | ORAL_TABLET | Freq: Three times a day (TID) | ORAL | 0 refills | Status: AC | PRN

## 2018-04-04 MED ORDER — ZIPRASIDONE HCL 40 MG PO CAPS: 40.0000 mg | ORAL_CAPSULE | Freq: Two times a day (BID) | ORAL | 0 refills | Status: AC

## 2018-04-04 NOTE — Progress Notes (Signed)
Nursing discharge note: Patient discharged home per MD order.  Patient received all personal belongings from unit and locker.  Reviewed AVS/transition record with patient and he indicates understanding. Patient received prescriptions of her medications. Patient will follow up with [facility].  Patient denies any thoughts of self harm.  He left ambulatory with family.

## 2018-04-04 NOTE — Plan of Care (Signed)
  Problem: Activity: Goal: Interest or engagement in activities will improve Outcome: Progressing Note:  Kristina Sawyer has been attending groups today.

## 2018-04-04 NOTE — Progress Notes (Signed)
Recreation Therapy Notes  Date: 9.20.19 Time: 0930 Location: [address] Dayroom  Group Topic: Stress Management  Goal Area(s) Addresses:  Patient will verbalize importance of using healthy stress management.  Patient will identify positive emotions associated with healthy stress management.   Behavioral Response: Engaged  Intervention: Stress Management  Activity : Progressive Muscle Relaxation.  LRT introduced the stress management technique of progressive muscle relaxation.  LRT read a script and lead patients through a series of techniques that guided them to tense each muscle group individually and then relax them.  Patients were to listen as script was read to engage in activity.  Education:  Stress Management, Discharge Planning.   Education Outcome: Acknowledges edcuation/In group clarification offered/Needs additional education  Clinical Observations/Feedback: Pt attended and participated in activity.    Caroll RancherMarjette Teralyn Mullins, LRT/CTRS         Caroll RancherLindsay, Jessyka Austria A 04/04/2018 12:21 PM
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
## 2018-04-04 NOTE — Plan of Care (Signed)
  Problem: Education: Goal: Knowledge of [facility] Education information/materials will improve Outcome: Completed/Met Goal: Emotional status will improve Outcome: Completed/Met Goal: Mental status will improve Outcome: Completed/Met Goal: Verbalization of understanding the information provided will improve Outcome: Completed/Met   Problem: Activity: Goal: Interest or engagement in activities will improve Outcome: Completed/Met Goal: Sleeping patterns will improve Outcome: Completed/Met   Problem: Coping: Goal: Ability to verbalize frustrations and anger appropriately will improve Outcome: Completed/Met Goal: Ability to demonstrate self-control will improve Outcome: Completed/Met   Problem: Health Behavior/Discharge Planning: Goal: Identification of resources available to assist in meeting health care needs will improve Outcome: Completed/Met Goal: Compliance with treatment plan for underlying cause of condition will improve Outcome: Completed/Met   Problem: Physical Regulation: Goal: Ability to maintain clinical measurements within normal limits will improve Outcome: Completed/Met   Problem: Safety: Goal: Periods of time without injury will increase Outcome: Completed/Met   Problem: Self-Concept: Goal: Ability to disclose and discuss suicidal ideas will improve Outcome: Completed/Met Goal: Will verbalize positive feelings about self Outcome: Completed/Met   Problem: Education: Goal: Utilization of techniques to improve thought processes will improve Outcome: Completed/Met Goal: Knowledge of the prescribed therapeutic regimen will improve Outcome: Completed/Met   Problem: Activity: Goal: Interest or engagement in leisure activities will improve Outcome: Completed/Met Goal: Imbalance in normal sleep/wake cycle will improve Outcome: Completed/Met   Problem: Spiritual Needs Goal: Ability to function at adequate level Outcome: Completed/Met

## 2018-04-04 NOTE — Progress Notes (Signed)
Adult Psychoeducational Group Note  Date:  04/04/2018 Time:  5:00 AM  Group Topic/Focus:  Wrap-Up Group:   The focus of this group is to help patients review their daily goal of treatment and discuss progress on daily workbooks.  Participation Level:  Active  Participation Quality:  Appropriate    Adult Psychoeducational Group Note  Date:  04/04/2018 Time:  5:01 AM  Group Topic/Focus:  Wrap-Up Group:   The focus of this group is to help patients review their daily goal of treatment and discuss progress on daily workbooks.  Participation Level:  Active    Participation Quality:  Appropriate  Affect:  Appropriate  Cognitive:  Appropriate  Insight: Appropriate and Good  Engagement in Group:  Engaged  Modes of Intervention:  Discussion  Additional Comments:  Her day was a 10. She had a good day hangin out with people she likes.  Charna Busmanobinson, Almena Hokenson Long 04/04/2018, 5:01 AM Affect:  Appropriate    Cognitive:  Appropriate  Insight: Appropriate and Good  Engagement in Group:  Engaged  Modes of Intervention:  Discussion  Additional Comments:  Her day was a 10. She had a good day hangin out with the other patients she like.  Charna Busmanobinson, Jahmel Flannagan Long 04/04/2018, 5:00 AM

## 2018-04-04 NOTE — [facility] Group Notes (Signed)
Pt was invited but did not attend orientation/goals group. 

## 2018-04-04 NOTE — Progress Notes (Addendum)
04/03/18: CSW contacted [employer] and received list of providers in [place] area.  Messages left with 4 providers.  04/04/18: CSW received call back from [facility] at [facility] in [place].  He is not able to see pt because he is a [facility] provider but not medicaid provider.  No one at that agency would be in network with both.  04/04/18: CSW spoke to Clydie [facility]Karen at [facility] and explained the problem with locating a provider who takes both [facility] and [place].  She will research any additional assistance they can provide.  Ref #[id number]. Garner NashGregory Shaniya Tashiro, MSW, LCSW Clinical Social Worker 04/04/2018 10:25 AM   04/04/18.  Message from [place] at [facility].  No assistance available.  04/04/18: [facility]: [facility] only provider in [place] who may take [facility] and Medicaid.  CS[facility] called [facility] and they do not have any therapists who are taking new pts. Garner NashGregory Anyia Gierke, MSW, LCSW Clinical Social Worker 04/04/2018 11:35 AM

## 2018-04-04 NOTE — Progress Notes (Signed)
  [facility] Discharge [facility] :  Will you be returning to the same living situation after discharge:  Yes,  with parent At discharge, do you have transportation home?: Yes,  mother Do you have the ability to pay for your medications: Yes,  medicaid/[employer]  Release of information consent forms completed and in the chart;  Patient's signature needed at discharge.  Patient to Follow up at: Follow-up Information    [facility] Go on 04/10/2018.   Why:  Please attend your medication appt with Guido SanderKathleen Flaherty on Thursday, 04/10/18, at 10:45am. Please attend your therapy appt with Joanna PuffAlex Bethune on Friday, 04/18/18, at 11:00am.  Contact information: [address],  [place], [place] [address] P: [phone] F: [phone]) [phone]          Next level of care provider has access to [facility] Link:no  Safety Planning and Suicide Prevention discussed: Yes,  with mother  Have you used any form of tobacco in the last 30 days? (Cigarettes, Smokeless Tobacco, Cigars, and/or Pipes): Yes  Has patient been referred to the Quitline?: Patient refused referral  Patient has been referred for addiction treatment: Pt. refused referral  Lorri FrederickWierda, Yoshito Gaza Jon, LCSW 04/04/2018, 11:58 AM

## 2018-04-04 NOTE — Progress Notes (Signed)
D:  Kristina Sawyer had been in the day room much of the evening.  He denied SI/HI or A/V hallucinations.  He reported feeling much better today and is looking forward to leaving tomorrow.  Encouraged him to talk to with nurses tomorrow before calling his mother for a ride home.  He denied any pain or discomfort and appeared to be in no physical distress.  He is currently resting with his eyes closed and appeared to be asleep.   A:  1:1 with RN for support and encouragement.  Medications as ordered.  Q 15 minute checks maintained for safety.  Encouraged participation in group and unit activities.   R:  He remains safe on the unit.  We will continue to monitor the progress towards his goals.

## 2018-04-04 NOTE — [facility] Group Notes (Signed)
Pt was invited but did not attend psycho-ed group.  
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
## 2018-04-16 MED FILL — PRAZOSIN 1 MG CAPSULE: 1 | 30 days supply | Qty: 30 | Fill #0

## 2018-04-25 MED FILL — ZIPRASIDONE HCL 40 MG CAP: 40 | 8 days supply | Qty: 15 | Fill #0

## 2018-05-01 MED FILL — hydrOXYzine HCL 25 MG TABS: 25 | 10 days supply | Qty: 30 | Fill #0

## 2018-05-12 MED FILL — ZIPRASIDONE HCL 40 MG CAP: 40 | 30 days supply | Qty: 60 | Fill #0

## 2018-05-13 DIAGNOSIS — F410 Panic disorder [episodic paroxysmal anxiety] without agoraphobia: Secondary | ICD-10-CM | POA: Diagnosis not present

## 2018-05-13 DIAGNOSIS — F4312 Post-traumatic stress disorder, chronic: Secondary | ICD-10-CM | POA: Diagnosis not present

## 2018-05-13 DIAGNOSIS — F3164 Bipolar disorder, current episode mixed, severe, with psychotic features: Secondary | ICD-10-CM | POA: Diagnosis not present

## 2018-05-13 DIAGNOSIS — F411 Generalized anxiety disorder: Secondary | ICD-10-CM | POA: Diagnosis not present

## 2018-05-13 MED FILL — PARoxetine HCL 20 MG TABS: 20 | 30 days supply | Qty: 30 | Fill #0

## 2018-05-13 MED FILL — PRAZOSIN 1 MG CAPSULE: 1 | 30 days supply | Qty: 30 | Fill #0

## 2018-05-13 MED FILL — lamoTRIgine 25 MG TABS: 25 | 30 days supply | Qty: 60 | Fill #0

## 2018-06-05 DIAGNOSIS — F411 Generalized anxiety disorder: Secondary | ICD-10-CM | POA: Diagnosis not present

## 2018-06-05 DIAGNOSIS — F3164 Bipolar disorder, current episode mixed, severe, with psychotic features: Secondary | ICD-10-CM | POA: Diagnosis not present

## 2018-06-05 DIAGNOSIS — F4312 Post-traumatic stress disorder, chronic: Secondary | ICD-10-CM | POA: Diagnosis not present

## 2018-06-05 DIAGNOSIS — F410 Panic disorder [episodic paroxysmal anxiety] without agoraphobia: Secondary | ICD-10-CM | POA: Diagnosis not present

## 2018-06-13 MED FILL — ZIPRASIDONE HCL 40 MG CAP: 40 | 15 days supply | Qty: 30 | Fill #0

## 2018-06-20 MED FILL — lamoTRIgine 25 MG TABS: 25 | 30 days supply | Qty: 60 | Fill #1

## 2018-06-28 DIAGNOSIS — R42 Dizziness and giddiness: Secondary | ICD-10-CM | POA: Diagnosis not present

## 2018-06-28 DIAGNOSIS — J0100 Acute maxillary sinusitis, unspecified: Secondary | ICD-10-CM | POA: Diagnosis not present

## 2018-06-28 DIAGNOSIS — J029 Acute pharyngitis, unspecified: Secondary | ICD-10-CM | POA: Diagnosis not present

## 2018-06-28 DIAGNOSIS — R000 Tachycardia, unspecified: Secondary | ICD-10-CM | POA: Diagnosis not present

## 2018-07-03 DIAGNOSIS — F411 Generalized anxiety disorder: Secondary | ICD-10-CM | POA: Diagnosis not present

## 2018-07-03 DIAGNOSIS — F4312 Post-traumatic stress disorder, chronic: Secondary | ICD-10-CM | POA: Diagnosis not present

## 2018-07-03 DIAGNOSIS — F410 Panic disorder [episodic paroxysmal anxiety] without agoraphobia: Secondary | ICD-10-CM | POA: Diagnosis not present

## 2018-07-03 DIAGNOSIS — F3164 Bipolar disorder, current episode mixed, severe, with psychotic features: Secondary | ICD-10-CM | POA: Diagnosis not present

## 2018-07-17 MED FILL — PRAZOSIN 1 MG CAPSULE: 1 | 30 days supply | Qty: 30 | Fill #1

## 2018-07-17 MED FILL — PARoxetine HCL 20 MG TABS: 20 | 30 days supply | Qty: 30 | Fill #1

## 2018-07-23 MED FILL — ZIPRASIDONE HCL 40 MG CAPS: 40 | 15 days supply | Qty: 30 | Fill #1

## 2018-07-23 MED FILL — lamoTRIgine 25 MG TABS: 25 | 30 days supply | Qty: 60 | Fill #2

## 2018-08-18 MED FILL — PRAZOSIN 1 MG CAPSULE: 1 | 30 days supply | Qty: 30 | Fill #2

## 2018-08-18 MED FILL — ZIPRASIDONE HCL 40 MG CAPS: 40 | 15 days supply | Qty: 30 | Fill #2

## 2018-08-18 MED FILL — PARoxetine HCL 20 MG TABS: 20 | 30 days supply | Qty: 30 | Fill #2

## 2018-09-12 DIAGNOSIS — R739 Hyperglycemia, unspecified: Secondary | ICD-10-CM | POA: Diagnosis not present

## 2018-09-12 DIAGNOSIS — N939 Abnormal uterine and vaginal bleeding, unspecified: Secondary | ICD-10-CM | POA: Diagnosis not present

## 2018-09-12 DIAGNOSIS — L700 Acne vulgaris: Secondary | ICD-10-CM | POA: Diagnosis not present

## 2018-09-12 DIAGNOSIS — J45990 Exercise induced bronchospasm: Secondary | ICD-10-CM | POA: Diagnosis not present

## 2018-09-15 MED FILL — PROPRANOLOL HCL 10 MG TAB: 10 | 30 days supply | Qty: 30 | Fill #0

## 2018-10-10 MED FILL — PROPRANOLOL 10 MG TABLET: 10 | 30 days supply | Qty: 30 | Fill #0

## 2018-10-10 MED FILL — PARoxetine HCL 20 MG TABS: 20 | 30 days supply | Qty: 30 | Fill #0

## 2018-10-10 MED FILL — PRAZOSIN 1 MG CAPSULE: 1 | 30 days supply | Qty: 30 | Fill #0

## 2018-11-03 MED FILL — PRAZOSIN 1 MG CAPSULE: 1 | 30 days supply | Qty: 30 | Fill #1

## 2018-11-13 DIAGNOSIS — R10813 Right lower quadrant abdominal tenderness: Secondary | ICD-10-CM | POA: Diagnosis not present

## 2018-11-13 DIAGNOSIS — M545 Low back pain: Secondary | ICD-10-CM | POA: Diagnosis not present

## 2018-11-13 DIAGNOSIS — N739 Female pelvic inflammatory disease, unspecified: Secondary | ICD-10-CM | POA: Diagnosis not present

## 2018-11-13 DIAGNOSIS — R109 Unspecified abdominal pain: Secondary | ICD-10-CM | POA: Diagnosis not present

## 2018-11-13 DIAGNOSIS — F17200 Nicotine dependence, unspecified, uncomplicated: Secondary | ICD-10-CM | POA: Diagnosis not present

## 2018-11-13 DIAGNOSIS — Z975 Presence of (intrauterine) contraceptive device: Secondary | ICD-10-CM | POA: Diagnosis not present

## 2018-11-13 DIAGNOSIS — N898 Other specified noninflammatory disorders of vagina: Secondary | ICD-10-CM | POA: Diagnosis not present

## 2018-11-13 DIAGNOSIS — Z9189 Other specified personal risk factors, not elsewhere classified: Secondary | ICD-10-CM | POA: Diagnosis not present

## 2018-11-13 DIAGNOSIS — D72829 Elevated white blood cell count, unspecified: Secondary | ICD-10-CM | POA: Diagnosis not present

## 2018-11-13 DIAGNOSIS — Z202 Contact with and (suspected) exposure to infections with a predominantly sexual mode of transmission: Secondary | ICD-10-CM | POA: Diagnosis not present

## 2018-11-13 DIAGNOSIS — R102 Pelvic and perineal pain: Secondary | ICD-10-CM | POA: Diagnosis not present

## 2018-11-13 DIAGNOSIS — R1031 Right lower quadrant pain: Secondary | ICD-10-CM | POA: Diagnosis not present

## 2018-11-13 DIAGNOSIS — R1032 Left lower quadrant pain: Secondary | ICD-10-CM | POA: Diagnosis not present

## 2018-11-13 DIAGNOSIS — R10814 Left lower quadrant abdominal tenderness: Secondary | ICD-10-CM | POA: Diagnosis not present

## 2018-12-09 MED FILL — PRAZOSIN 1 MG CAPSULE: 1 | 7 days supply | Qty: 7 | Fill #0

## 2018-12-09 MED FILL — lamoTRIgine 25 MG TABS: 25 | 7 days supply | Qty: 14 | Fill #0

## 2018-12-09 MED FILL — PARoxetine HCL 20 MG TABS: 20 | 7 days supply | Qty: 7 | Fill #0

## 2018-12-09 MED FILL — ZIPRASIDONE HCL 40 MG CAPS: 40 | 7 days supply | Qty: 14 | Fill #0

## 2019-01-12 DIAGNOSIS — F411 Generalized anxiety disorder: Secondary | ICD-10-CM | POA: Diagnosis not present

## 2019-01-12 DIAGNOSIS — F3164 Bipolar disorder, current episode mixed, severe, with psychotic features: Secondary | ICD-10-CM | POA: Diagnosis not present

## 2019-01-12 DIAGNOSIS — F410 Panic disorder [episodic paroxysmal anxiety] without agoraphobia: Secondary | ICD-10-CM | POA: Diagnosis not present

## 2019-01-12 MED FILL — busPIRone HCL 15 MG TABS: 15 | 30 days supply | Qty: 60 | Fill #0

## 2019-01-12 MED FILL — PRAZOSIN 1 MG CAPSULE: 1 | 30 days supply | Qty: 30 | Fill #0

## 2019-01-12 MED FILL — PARoxetine HCL 20 MG TABS: 20 | 30 days supply | Qty: 30 | Fill #0

## 2019-01-12 MED FILL — lamoTRIgine 25 MG TABS: 25 | 30 days supply | Qty: 42 | Fill #0

## 2019-01-12 MED FILL — ZIPRASIDONE HCL 40 MG CAPS: 40 | 30 days supply | Qty: 60 | Fill #0

## 2019-02-09 MED FILL — busPIRone HCL 15 MG TABS: 15 | 30 days supply | Qty: 60 | Fill #1

## 2019-02-09 MED FILL — PARoxetine HCL 20 MG TABS: 20 | 30 days supply | Qty: 30 | Fill #1

## 2019-02-09 MED FILL — PRAZOSIN 1 MG CAPSULE: 1 | 30 days supply | Qty: 30 | Fill #1

## 2019-02-09 MED FILL — ZIPRASIDONE HCL 40 MG CAPS: 40 | 30 days supply | Qty: 60 | Fill #1

## 2019-02-12 MED FILL — lamoTRIgine 25 MG TABS: 25 | 30 days supply | Qty: 60 | Fill #0

## 2019-03-05 MED FILL — busPIRone HCL 15 MG TABS: 15 | 30 days supply | Qty: 60 | Fill #2

## 2019-03-05 MED FILL — ZIPRASIDONE HCL 40 MG CAPS: 40 | 30 days supply | Qty: 60 | Fill #2

## 2019-03-05 MED FILL — PARoxetine HCL 20 MG TABS: 20 | 30 days supply | Qty: 30 | Fill #2

## 2019-03-05 MED FILL — traZODone HCL 100 MG TABS: 100 | 30 days supply | Qty: 30 | Fill #0

## 2019-03-05 MED FILL — PRAZOSIN 1 MG CAPSULE: 1 | 30 days supply | Qty: 30 | Fill #2

## 2019-03-10 DIAGNOSIS — F3164 Bipolar disorder, current episode mixed, severe, with psychotic features: Secondary | ICD-10-CM | POA: Diagnosis not present

## 2019-03-10 DIAGNOSIS — F411 Generalized anxiety disorder: Secondary | ICD-10-CM | POA: Diagnosis not present

## 2019-03-10 DIAGNOSIS — F902 Attention-deficit hyperactivity disorder, combined type: Secondary | ICD-10-CM | POA: Diagnosis not present

## 2019-03-19 MED FILL — buPROPion HCL ER (XL) 150 M: 150 | 30 days supply | Qty: 30 | Fill #0

## 2019-03-21 DIAGNOSIS — Z3046 Encounter for surveillance of implantable subdermal contraceptive: Secondary | ICD-10-CM | POA: Diagnosis not present

## 2019-03-21 DIAGNOSIS — Z30013 Encounter for initial prescription of injectable contraceptive: Secondary | ICD-10-CM | POA: Diagnosis not present

## 2019-03-26 MED FILL — lamoTRIgine 25 MG TABS: 25 | 30 days supply | Qty: 60 | Fill #1

## 2019-04-13 MED FILL — buPROPion HCL ER (XL) 150 M: 150 | 30 days supply | Qty: 30 | Fill #1

## 2019-04-16 DIAGNOSIS — F4312 Post-traumatic stress disorder, chronic: Secondary | ICD-10-CM | POA: Diagnosis not present

## 2019-04-16 DIAGNOSIS — F411 Generalized anxiety disorder: Secondary | ICD-10-CM | POA: Diagnosis not present

## 2019-04-16 DIAGNOSIS — F410 Panic disorder [episodic paroxysmal anxiety] without agoraphobia: Secondary | ICD-10-CM | POA: Diagnosis not present

## 2019-04-16 DIAGNOSIS — F3164 Bipolar disorder, current episode mixed, severe, with psychotic features: Secondary | ICD-10-CM | POA: Diagnosis not present

## 2019-04-16 MED FILL — ZIPRASIDONE HCL 60 MG CAP: 60 | 30 days supply | Qty: 30 | Fill #0

## 2019-04-20 ENCOUNTER — Other Ambulatory Visit: Payer: Self-pay

## 2019-04-20 ENCOUNTER — Emergency Department (HOSPITAL_COMMUNITY)
Admission: EM | Admit: 2019-04-20 | Discharge: 2019-04-20 | Disposition: A | Discharge status: Home / Self Care | Payer: 59 | Attending: Emergency Medicine | Admitting: Emergency Medicine

## 2019-04-20 ENCOUNTER — Emergency Department (HOSPITAL_COMMUNITY): Payer: 59

## 2019-04-20 ENCOUNTER — Encounter (HOSPITAL_COMMUNITY): Payer: Self-pay | Admitting: *Deleted

## 2019-04-20 DIAGNOSIS — Z79899 Other long term (current) drug therapy: Secondary | ICD-10-CM | POA: Diagnosis not present

## 2019-04-20 DIAGNOSIS — R1030 Lower abdominal pain, unspecified: Secondary | ICD-10-CM

## 2019-04-20 DIAGNOSIS — F17210 Nicotine dependence, cigarettes, uncomplicated: Secondary | ICD-10-CM | POA: Insufficient documentation

## 2019-04-20 DIAGNOSIS — R1031 Right lower quadrant pain: Secondary | ICD-10-CM | POA: Insufficient documentation

## 2019-04-20 DIAGNOSIS — R109 Unspecified abdominal pain: Secondary | ICD-10-CM | POA: Diagnosis not present

## 2019-04-20 DIAGNOSIS — J45909 Unspecified asthma, uncomplicated: Secondary | ICD-10-CM | POA: Diagnosis not present

## 2019-04-20 DIAGNOSIS — R634 Abnormal weight loss: Secondary | ICD-10-CM | POA: Diagnosis not present

## 2019-04-20 HISTORY — DX: Borderline personality disorder: F60.3

## 2019-04-20 HISTORY — DX: Other psychoactive substance abuse, uncomplicated: F19.10

## 2019-04-20 LAB — COMPREHENSIVE METABOLIC PANEL
ALT: 19 U/L (ref 0–44)
AST: 24 U/L (ref 15–41)
Albumin: 4.1 g/dL (ref 3.5–5.0)
Alkaline Phosphatase: 63 U/L (ref 38–126)
Anion gap: 9 (ref 5–15)
BUN: 12 mg/dL (ref 6–20)
CO2: 23 mmol/L (ref 22–32)
Calcium: 9.6 mg/dL (ref 8.9–10.3)
Chloride: 105 mmol/L (ref 98–111)
Creatinine, Ser: 0.74 mg/dL (ref 0.44–1.00)
GFR calc Af Amer: >60 mL/min (ref >60)
GFR calc non Af Amer: >60 mL/min (ref >60)
Glucose, Bld: 107 mg/dL — ABNORMAL HIGH (ref 70–99)
Potassium: 4.3 mmol/L (ref 3.5–5.1)
Sodium: 137 mmol/L (ref 135–145)
Total Bilirubin: 0.8 mg/dL (ref 0.3–1.2)
Total Protein: 7.3 g/dL (ref 6.5–8.1)

## 2019-04-20 LAB — CBC
HCT: 47.2 % — ABNORMAL HIGH (ref 36.0–46.0)
Hemoglobin: 16.1 g/dL — ABNORMAL HIGH (ref 12.0–15.0)
MCH: 32.6 pg (ref 26.0–34.0)
MCHC: 34.1 g/dL (ref 30.0–36.0)
MCV: 95.5 fL (ref 80.0–100.0)
Platelets: 277 10*3/uL (ref 150–400)
RBC: 4.94 MIL/uL (ref 3.87–5.11)
RDW: 12.2 % (ref 11.5–15.5)
WBC: 13.4 10*3/uL — ABNORMAL HIGH (ref 4.0–10.5)
nRBC: 0 % (ref 0.0–0.2)

## 2019-04-20 LAB — URINALYSIS, ROUTINE W REFLEX MICROSCOPIC
Bilirubin Urine: NEGATIVE
Glucose, UA: NEGATIVE mg/dL
Hgb urine dipstick: NEGATIVE
Ketones, ur: NEGATIVE mg/dL
Leukocytes,Ua: NEGATIVE
Nitrite: NEGATIVE
Protein, ur: NEGATIVE mg/dL
Specific Gravity, Urine: 1.008 (ref 1.005–1.030)
pH: 8 (ref 5.0–8.0)

## 2019-04-20 LAB — LIPASE, BLOOD: Lipase: 28 U/L (ref 11–51)

## 2019-04-20 LAB — I-STAT BETA HCG BLOOD, ED (MC, WL, AP ONLY): I-stat hCG, quantitative: <5 m[IU]/mL (ref <5)

## 2019-04-20 LAB — CBG MONITORING, ED: Glucose-Capillary: 121 mg/dL — ABNORMAL HIGH (ref 70–99)

## 2019-04-20 MED ORDER — ZIPRASIDONE HCL 40 MG PO CAPS
40.0000 mg | ORAL_CAPSULE | Freq: Once | ORAL | Status: AC
  Administered 2019-04-20: 40 mg via ORAL
  Filled 2019-04-20: qty 1

## 2019-04-20 MED ORDER — IOHEXOL 300 MG/ML  SOLN
100.0000 mL | Freq: Once | INTRAMUSCULAR | Status: AC | PRN
  Administered 2019-04-20: 10:00:00 100 mL via INTRAVENOUS

## 2019-04-20 MED ORDER — BUPROPION HCL ER (XL) 150 MG PO TB24
150.0000 mg | ORAL_TABLET | Freq: Once | ORAL | Status: AC
  Administered 2019-04-20: 150 mg via ORAL
  Filled 2019-04-20: qty 1

## 2019-04-20 MED ORDER — KETOROLAC TROMETHAMINE 15 MG/ML IJ SOLN
15.0000 mg | Freq: Once | INTRAMUSCULAR | Status: AC
  Administered 2019-04-20: 15 mg via INTRAVENOUS
  Filled 2019-04-20: qty 1

## 2019-04-20 MED ORDER — DICYCLOMINE HCL 20 MG PO TABS: 20.0000 mg | ORAL_TABLET | Freq: Four times a day (QID) | ORAL | 0 refills | Status: AC | PRN

## 2019-04-20 MED ORDER — LAMOTRIGINE 25 MG PO TABS
50.0000 mg | ORAL_TABLET | Freq: Once | ORAL | Status: AC
  Administered 2019-04-20: 09:00:00 50 mg via ORAL
  Filled 2019-04-20: qty 2

## 2019-04-20 MED ORDER — SODIUM CHLORIDE 0.9 % IV BOLUS
1000.0000 mL | Freq: Once | INTRAVENOUS | Status: AC
  Administered 2019-04-20: 1000 mL via INTRAVENOUS

## 2019-04-20 MED FILL — DICYCLOMINE 20 MG TABLET: 20 | 7 days supply | Qty: 28 | Fill #0

## 2019-04-20 MED FILL — traZODone HCL 100 MG TABS: 100 | 30 days supply | Qty: 30 | Fill #1

## 2019-04-20 MED FILL — lamoTRIgine 25 MG TABS: 25 | 30 days supply | Qty: 60 | Fill #2

## 2019-04-20 NOTE — ED Provider Notes (Signed)
[facility] Provider Note   CSN: [id number] Arrival date & time: 04/20/19  [id number]     History   Chief Complaint Chief Complaint  Patient presents with  . Abdominal Pain  . Weight Loss    HPI Kristina Sawyer is a 18 y.o. female.     HPI  18 year old female presents with abdominal pain and inability to eat.  She states this has been ongoing since December 2019.  She states she has been having lower abdominal pain ever since then.  In April she went to the [place] in [place] and was treated for PID.  She states the pain has remained though she does not have any vaginal bleeding or discharge.  Whenever she tries to eat she will immediately feel increased pain.  She otherwise has pain throughout the day.  Often will vomit and sometimes immediately has to go to the bathroom for a bowel movement though she denies diarrhea.  She has some chronic back pain but no new back pain.  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  She still uses marijuana but while this seems to increase her hunger, she still develops a lot of pain with eating.  She describes as sharp.  She is asking for a feeding tube.  Past Medical History:  Diagnosis Date  . Asthma   . Borderline personality disorder (HCC)   . Drug abuse [facility](HCC)     Patient Active Problem List   Diagnosis Date Noted  . Severe recurrent major depression without psychotic features (HCC) 04/01/2018  . Borderline personality disorder (HCC)   . MDD (major depressive disorder), recurrent episode, severe (HCC) 10/07/2017  . MDD (major depressive disorder), severe (HCC) 05/21/2017  . MDD (major depressive disorder) 05/20/2017  . Depressed 02/15/2014  . Suicide attempt by drug ingestion (HCC) 02/09/2014  . Depression 02/09/2014  . Anxiety state, unspecified 02/09/2014    History reviewed. No pertinent surgical history.   OB History   No obstetric history on file.      Home  Medications    Prior to Admission medications   Medication Sig Start Date End Date Taking? Authorizing Provider  buPROPion (WELLBUTRIN XL) 150 MG 24 hr tablet Take 150 mg by mouth daily.   Yes [provider]  busPIRone (BUSPAR) 15 MG tablet Take 15 mg by mouth 2 (two) times daily.   Yes [provider]  hydrOXYzine (ATARAX/VISTARIL) 25 MG tablet Take 1 tablet (25 mg total) by mouth 3 (three) times daily as needed for anxiety. 04/04/18  Yes Money, Gerlene Burdockravis B, FNP  lamoTRIgine (LAMICTAL) 25 MG tablet Take 50 mg by mouth daily.   Yes [provider]  PARoxetine (PAXIL) 20 MG tablet Take 1 tablet (20 mg total) by mouth daily. For mood control 04/05/18  Yes Money, Gerlene Burdockravis B, FNP  prazosin (MINIPRESS) 1 MG capsule Take 1 capsule (1 mg total) by mouth at bedtime. 10/12/17  Yes Oneta RackLewis, Tanika N, NP  ziprasidone (GEODON) 40 MG capsule Take 1 capsule (40 mg total) by mouth 2 (two) times daily with a meal. For mood control Patient taking differently: Take 40-60 mg by mouth See admin instructions. Take 40mg  in the AM and 60mg  in the PM for mood control. Take with meals. 04/04/18  Yes Money, Gerlene Burdockravis B, FNP  dicyclomine (BENTYL) 20 MG tablet Take 1 tablet (20 mg total) by mouth 4 (four) times daily as needed (abdominal pain). 04/20/19   Pricilla LovelessGoldston, Jakeb Lamping,  MD    Family History No family history on file.  Social History Social History   Tobacco Use  . Smoking status: Current Some Day Smoker    Types: Cigarettes  . Smokeless tobacco: Never Used  Substance Use Topics  . Alcohol use: Not Currently    Comment: binge drinker  . Drug use: Not Currently    Types: Marijuana, Methamphetamines, Cocaine    Comment: rehab     Allergies   Lactose intolerance (gi)   Review of Systems Review of Systems  Constitutional: Positive for unexpected weight change. Negative for fever.  Gastrointestinal: Positive for abdominal pain and vomiting. Negative for diarrhea.  Genitourinary: Negative for  vaginal bleeding and vaginal discharge.  Musculoskeletal: Positive for back pain (chronic).  All other systems reviewed and are negative.    Physical Exam Updated Vital Signs BP 113/68   Pulse 91   Temp 99.2 F (37.3 C) (Oral)   Resp 14   Ht 5\' 6"  (1.676 m)   Wt 68 kg   LMP 04/04/2019   SpO2 98%   BMI 24.21 kg/m   Physical Exam Vitals signs and nursing note reviewed.  Constitutional:      General: She is not in acute distress.    Appearance: She is well-developed. She is not ill-appearing or diaphoretic.  HENT:     Head: Normocephalic and atraumatic.     Right Ear: External ear normal.     Left Ear: External ear normal.     Nose: Nose normal.  Eyes:     General:        Right eye: No discharge.        Left eye: No discharge.  Cardiovascular:     Rate and Rhythm: Normal rate and regular rhythm.     Heart sounds: Normal heart sounds.  Pulmonary:     Effort: Pulmonary effort is normal.     Breath sounds: Normal breath sounds.  Abdominal:     Palpations: Abdomen is soft.     Tenderness: There is abdominal tenderness in the right lower quadrant, suprapubic area and left lower quadrant.  Skin:    General: Skin is warm and dry.  Neurological:     Mental Status: She is alert.  Psychiatric:        Mood and Affect: Mood is not anxious.      ED Treatments / Results  Labs (all labs ordered are listed, but only abnormal results are displayed) Labs Reviewed  COMPREHENSIVE METABOLIC PANEL - Abnormal; Notable for the following components:      Result Value   Glucose, Bld 107 (*)    All other components within normal limits  CBC - Abnormal; Notable for the following components:   WBC 13.4 (*)    Hemoglobin 16.1 (*)    HCT 47.2 (*)    All other components within normal limits  URINALYSIS, ROUTINE W REFLEX MICROSCOPIC - Abnormal; Notable for the following components:   Color, Urine STRAW (*)    All other components within normal limits  CBG MONITORING, ED - Abnormal;  Notable for the following components:   Glucose-Capillary 121 (*)    All other components within normal limits  LIPASE, BLOOD  I-STAT BETA HCG BLOOD, ED ([facility], [facility], [facility] ONLY)    EKG None  Radiology Ct Abdomen Pelvis W Contrast  Result Date: 04/20/2019 CLINICAL DATA:  Unintended weight loss with nonlocalized abdominal pain EXAM: CT ABDOMEN AND PELVIS WITH CONTRAST TECHNIQUE: Multidetector CT imaging of the abdomen and  pelvis was performed using the standard protocol following bolus administration of intravenous contrast. CONTRAST:  116mL OMNIPAQUE IOHEXOL 300 MG/ML  SOLN COMPARISON:  None. FINDINGS: Lower chest:  No contributory findings. Hepatobiliary: No focal liver abnormality.No evidence of biliary obstruction or stone. Pancreas: Unremarkable. Spleen: Unremarkable. Adrenals/Urinary Tract: Negative adrenals. No hydronephrosis or stone. Unremarkable bladder. Stomach/Bowel:  No obstruction. No appendicitis. Vascular/Lymphatic: No acute vascular abnormality. No mass or adenopathy. Reproductive:No pathologic findings. Other: No ascites or pneumoperitoneum. Musculoskeletal: No acute abnormalities. Incomplete segmentation from the transitional lumbosacral vertebra from the sacrum on the right IMPRESSION: Negative.  No explanation for abdominal pain. Electronically Signed   By: Monte Fantasia M.D.   On: 04/20/2019 09:42    Procedures Procedures (including critical care time)  Medications Ordered in ED Medications  sodium chloride 0.9 % bolus 1,000 mL (1,000 mLs Intravenous New Bag/Given 04/20/19 0816)  buPROPion (WELLBUTRIN XL) 24 hr tablet 150 mg (150 mg Oral Given 04/20/19 1004)  lamoTRIgine (LAMICTAL) tablet 50 mg (50 mg Oral Given 04/20/19 0909)  ziprasidone (GEODON) capsule 40 mg (40 mg Oral Given 04/20/19 0909)  ketorolac (TORADOL) 15 MG/ML injection 15 mg (15 mg Intravenous Given 04/20/19 0908)  iohexol (OMNIPAQUE) 300 MG/ML solution 100 mL (100 mLs Intravenous Contrast Given 04/20/19 0934)      Initial Impression / Assessment and Plan / ED Course  I have reviewed the triage vital signs and the nursing notes.  Pertinent labs & imaging results that were available during my care of the patient were reviewed by me and considered in my medical decision making (see chart for details).        No clear etiology for the patient's months and almost year-long abdominal pain.  She states she has tried multiple things at home including antacids.  Exam shows some nonfocal diffuse lower abdominal tenderness.  My suspicion for acute pelvic pathology/emergency such as PID is low given she was already treated for this and the length of time of her symptoms.  Otherwise, I think trying her on Bentyl and having her follow-up with a gastroenterologist would be warranted.  She would like to follow-up with one in [place] and will investigate this.  Otherwise, discharged home with return precautions.  Final Clinical Impressions(s) / ED Diagnoses   Final diagnoses:  Lower abdominal pain    ED Discharge Orders         Ordered    dicyclomine (BENTYL) 20 MG tablet  4 times daily PRN     04/20/19 1006           Sherwood Gambler, MD 04/20/19 1012

## 2019-04-20 NOTE — ED Notes (Signed)
Patient transported to CT 

## 2019-04-20 NOTE — Discharge Instructions (Signed)
If you develop worsening, continued, or recurrent abdominal pain, uncontrolled vomiting, fever, chest or back pain, or any other new/concerning symptoms then return to the ER for evaluation.  

## 2019-04-20 NOTE — ED Triage Notes (Signed)
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  Since Dec pt states she's unable to eat and her stomach hurts every time she eats and she "pees and shits" herself whenever she eats.

## 2019-04-21 MED FILL — PRAZOSIN 1 MG CAPSULE: 1 | 30 days supply | Qty: 30 | Fill #0

## 2019-04-21 MED FILL — PARoxetine HCL 20 MG TABS: 20 | 30 days supply | Qty: 30 | Fill #0

## 2019-04-30 MED FILL — busPIRone HCL 15 MG TABS: 15 | 30 days supply | Qty: 60 | Fill #0

## 2019-05-12 DIAGNOSIS — Z3009 Encounter for other general counseling and advice on contraception: Secondary | ICD-10-CM | POA: Diagnosis not present

## 2019-05-12 DIAGNOSIS — K589 Irritable bowel syndrome without diarrhea: Secondary | ICD-10-CM | POA: Diagnosis not present

## 2019-05-12 DIAGNOSIS — L700 Acne vulgaris: Secondary | ICD-10-CM | POA: Diagnosis not present

## 2019-05-17 MED FILL — ZIPRASIDONE HCL 60 MG CAP: 60 | 30 days supply | Qty: 30 | Fill #1

## 2019-05-17 MED FILL — buPROPion HCL ER (XL) 150 M: 150 | 30 days supply | Qty: 30 | Fill #2

## 2019-05-17 MED FILL — traZODone HCL 100 MG TABS: 100 | 30 days supply | Qty: 30 | Fill #2

## 2019-05-17 MED FILL — PRAZOSIN 1 MG CAPSULE: 1 | 30 days supply | Qty: 30 | Fill #1

## 2019-05-17 MED FILL — PARoxetine HCL 20 MG TABS: 20 | 30 days supply | Qty: 30 | Fill #1

## 2019-05-18 MED FILL — ZIPRASIDONE HCL 40 MG CAPS: 40 | 30 days supply | Qty: 30 | Fill #0

## 2019-05-18 MED FILL — lamoTRIgine 25 MG TABS: 25 | 30 days supply | Qty: 60 | Fill #0

## 2019-06-15 MED FILL — ZIPRASIDONE HCL 40 MG CAP: 40 | 30 days supply | Qty: 30 | Fill #1

## 2019-06-16 MED FILL — buPROPion HCL ER (XL) 150 M: 150 | 30 days supply | Qty: 30 | Fill #0

## 2019-06-16 MED FILL — traZODone HCL 100 MG TABS: 100 | 30 days supply | Qty: 30 | Fill #0

## 2019-06-17 DIAGNOSIS — F3164 Bipolar disorder, current episode mixed, severe, with psychotic features: Secondary | ICD-10-CM | POA: Diagnosis not present

## 2019-06-17 DIAGNOSIS — F411 Generalized anxiety disorder: Secondary | ICD-10-CM | POA: Diagnosis not present

## 2019-06-17 DIAGNOSIS — F902 Attention-deficit hyperactivity disorder, combined type: Secondary | ICD-10-CM | POA: Diagnosis not present

## 2019-07-06 MED FILL — ZIPRASIDONE HCL 60 MG CAP: 60 | 15 days supply | Qty: 30 | Fill #0

## 2019-07-06 MED FILL — busPIRone HCL 15 MG TABS: 15 | 30 days supply | Qty: 60 | Fill #2

## 2019-07-06 MED FILL — lamoTRIgine 25 MG TABS: 25 | 30 days supply | Qty: 60 | Fill #2

## 2019-07-07 MED FILL — PRAZOSIN 1 MG CAPSULE: 1 | 30 days supply | Qty: 30 | Fill #0

## 2019-07-07 MED FILL — PARoxetine HCL 20 MG TABS: 20 | 30 days supply | Qty: 30 | Fill #0

## 2019-07-20 MED FILL — traZODone HCL 100 MG TABS: 100 | 30 days supply | Qty: 30 | Fill #1

## 2019-07-23 DIAGNOSIS — F3164 Bipolar disorder, current episode mixed, severe, with psychotic features: Secondary | ICD-10-CM | POA: Diagnosis not present

## 2019-07-23 DIAGNOSIS — F411 Generalized anxiety disorder: Secondary | ICD-10-CM | POA: Diagnosis not present

## 2019-08-03 MED FILL — PRAZOSIN 1 MG CAPSULE: 1 | 30 days supply | Qty: 30 | Fill #1

## 2019-08-04 MED FILL — lamoTRIgine 25 MG TABS: 25 | 30 days supply | Qty: 60 | Fill #0

## 2019-08-04 MED FILL — ZIPRASIDONE HCL 60 MG CAP: 60 | 30 days supply | Qty: 60 | Fill #0

## 2019-08-12 MED FILL — busPIRone HCL 15 MG TABS: 15 | 30 days supply | Qty: 60 | Fill #0

## 2019-08-12 MED FILL — PARoxetine HCL 20 MG TABS: 20 | 30 days supply | Qty: 30 | Fill #1

## 2019-08-20 MED FILL — traZODone HCL 100 MG TABS: 100 | 30 days supply | Qty: 30 | Fill #2

## 2019-08-26 DIAGNOSIS — F902 Attention-deficit hyperactivity disorder, combined type: Secondary | ICD-10-CM | POA: Diagnosis not present

## 2019-08-26 DIAGNOSIS — F3164 Bipolar disorder, current episode mixed, severe, with psychotic features: Secondary | ICD-10-CM | POA: Diagnosis not present

## 2019-08-26 DIAGNOSIS — F411 Generalized anxiety disorder: Secondary | ICD-10-CM | POA: Diagnosis not present

## 2019-08-31 MED FILL — lamoTRIgine 25 MG TABS: 25 | 30 days supply | Qty: 60 | Fill #1

## 2019-08-31 MED FILL — PRAZOSIN 1 MG CAPSULE: 1 | 30 days supply | Qty: 30 | Fill #2

## 2019-09-07 MED FILL — PARoxetine HCL 20 MG TABS: 20 | 30 days supply | Qty: 30 | Fill #2

## 2019-09-07 MED FILL — ZIPRASIDONE HCL 60 MG CAP: 60 | 30 days supply | Qty: 60 | Fill #1

## 2019-09-14 MED FILL — busPIRone HCL 15 MG TABS: 15 | 30 days supply | Qty: 60 | Fill #1

## 2019-09-14 MED FILL — traZODone HCL 100 MG TABS: 100 | 30 days supply | Qty: 30 | Fill #0

## 2019-09-21 DIAGNOSIS — Z3009 Encounter for other general counseling and advice on contraception: Secondary | ICD-10-CM | POA: Diagnosis not present

## 2019-09-21 DIAGNOSIS — L700 Acne vulgaris: Secondary | ICD-10-CM | POA: Diagnosis not present

## 2019-09-23 DIAGNOSIS — F411 Generalized anxiety disorder: Secondary | ICD-10-CM | POA: Diagnosis not present

## 2019-09-23 DIAGNOSIS — F410 Panic disorder [episodic paroxysmal anxiety] without agoraphobia: Secondary | ICD-10-CM | POA: Diagnosis not present

## 2019-09-23 DIAGNOSIS — F3164 Bipolar disorder, current episode mixed, severe, with psychotic features: Secondary | ICD-10-CM | POA: Diagnosis not present

## 2019-09-24 MED FILL — lamoTRIgine 25 MG TABS: 25 | 30 days supply | Qty: 60 | Fill #2

## 2019-09-29 MED FILL — PRAZOSIN 1 MG CAPSULE: 1 | 30 days supply | Qty: 30 | Fill #0

## 2019-10-05 MED FILL — PARoxetine HCL 20 MG TABS: 20 | 30 days supply | Qty: 30 | Fill #0

## 2019-10-12 MED FILL — busPIRone HCL 15 MG TABS: 15 | 30 days supply | Qty: 60 | Fill #2

## 2019-10-12 MED FILL — ZIPRASIDONE HCL 60 MG CAP: 60 | 30 days supply | Qty: 60 | Fill #2

## 2019-10-12 MED FILL — traZODone HCL 100 MG TABS: 100 | 30 days supply | Qty: 30 | Fill #0

## 2019-10-26 MED FILL — PRAZOSIN 1 MG CAPSULE: 1 | 30 days supply | Qty: 30 | Fill #1

## 2019-10-27 DIAGNOSIS — F3164 Bipolar disorder, current episode mixed, severe, with psychotic features: Secondary | ICD-10-CM | POA: Diagnosis not present

## 2019-10-27 DIAGNOSIS — F411 Generalized anxiety disorder: Secondary | ICD-10-CM | POA: Diagnosis not present

## 2019-10-27 MED FILL — lamoTRIgine 25 MG TABS: 25 | 30 days supply | Qty: 60 | Fill #0

## 2019-11-06 MED FILL — PARoxetine HCL 20 MG TABS: 20 | 30 days supply | Qty: 30 | Fill #1

## 2019-11-09 MED FILL — ZIPRASIDONE HCL 60 MG CAP: 60 | 30 days supply | Qty: 60 | Fill #0

## 2019-11-09 MED FILL — traZODone HCL 100 MG TABS: 100 | 30 days supply | Qty: 30 | Fill #0

## 2019-11-11 MED FILL — busPIRone HCL 15 MG TABS: 15 | 30 days supply | Qty: 60 | Fill #0

## 2019-11-12 ENCOUNTER — Ambulatory Visit: Payer: 59 | Attending: Internal Medicine

## 2019-11-12 DIAGNOSIS — Z23 Encounter for immunization: Secondary | ICD-10-CM

## 2019-11-12 NOTE — Progress Notes (Signed)
   Covid-19 Vaccination Clinic  Name:  Kristina Sawyer    MRN: [id number] DOB: [birth year]  11/12/2019  Kristina Sawyer was observed post Covid-19 immunization for 15 minutes without incident. She was provided with Vaccine Information Sheet and instruction to access the V-Safe system.   Kristina Sawyer was instructed to call 911 with any severe reactions post vaccine: Marland Kitchen Difficulty breathing  . Swelling of face and throat  . A fast heartbeat  . A bad rash all over body  . Dizziness and weakness   Immunizations Administered    Name Date Dose VIS Date Route   Pfizer COVID-19 Vaccine 11/12/2019  4:14 PM 0.3 mL 09/09/2018 Intramuscular   Manufacturer: [employer], [employer]   Lot: [id number]   NDC: [address]-7

## 2019-11-19 DIAGNOSIS — F411 Generalized anxiety disorder: Secondary | ICD-10-CM | POA: Diagnosis not present

## 2019-11-19 DIAGNOSIS — F3164 Bipolar disorder, current episode mixed, severe, with psychotic features: Secondary | ICD-10-CM | POA: Diagnosis not present

## 2019-11-19 MED FILL — PRAZOSIN 1 MG CAPSULE: 1 | 30 days supply | Qty: 90 | Fill #0

## 2019-12-07 ENCOUNTER — Ambulatory Visit: Payer: 59 | Attending: Internal Medicine

## 2019-12-07 DIAGNOSIS — Z23 Encounter for immunization: Secondary | ICD-10-CM

## 2019-12-07 NOTE — Progress Notes (Signed)
   Covid-19 Vaccination Clinic  Name:  Kristina Sawyer    MRN: [id number] DOB: 02-06-2001  12/07/2019  Kristina Sawyer was observed post Covid-19 immunization for 15 minutes without incident. She was provided with Vaccine Information Sheet and instruction to access the V-Safe system.   Kristina Sawyer was instructed to call 911 with any severe reactions post vaccine: Marland Kitchen Difficulty breathing  . Swelling of face and throat  . A fast heartbeat  . A bad rash all over body  . Dizziness and weakness   Immunizations Administered    Name Date Dose VIS Date Route   Pfizer COVID-19 Vaccine 12/07/2019  4:05 PM 0.3 mL 09/09/2018 Intramuscular   Manufacturer: [employer], [employer]   Lot: [id number]   NDC: [address]-8

## 2019-12-08 MED FILL — busPIRone HCL 15 MG TABS: 15 | 30 days supply | Qty: 60 | Fill #1

## 2019-12-09 MED FILL — traZODone HCL 100 MG TABS: 100 | 30 days supply | Qty: 30 | Fill #0

## 2019-12-15 MED FILL — PARoxetine HCL 20 MG TABS: 20 | 30 days supply | Qty: 30 | Fill #2

## 2019-12-15 MED FILL — ZIPRASIDONE HCL 60 MG CAP: 60 | 30 days supply | Qty: 60 | Fill #1

## 2019-12-16 MED FILL — PRAZOSIN 1 MG CAPSULE: 1 | 30 days supply | Qty: 90 | Fill #1

## 2020-01-11 MED FILL — traZODone HCL 100 MG TABS: 100 | 30 days supply | Qty: 30 | Fill #0

## 2020-01-11 MED FILL — busPIRone HCL 15 MG TABS: 15 | 30 days supply | Qty: 60 | Fill #2

## 2020-01-13 MED FILL — PRAZOSIN 1 MG CAPSULE: 1 | 30 days supply | Qty: 90 | Fill #2

## 2020-01-18 MED FILL — ZIPRASIDONE HCL 60 MG CAP: 60 | 30 days supply | Qty: 60 | Fill #2

## 2020-01-19 MED FILL — PARoxetine HCL 20 MG TABS: 20 | 30 days supply | Qty: 30 | Fill #0

## 2020-01-19 MED FILL — lamoTRIgine 25 MG TABS: 25 | 30 days supply | Qty: 60 | Fill #0

## 2020-02-08 MED FILL — busPIRone HCL 15 MG TABS: 15 | 30 days supply | Qty: 60 | Fill #0

## 2020-02-10 DIAGNOSIS — F3164 Bipolar disorder, current episode mixed, severe, with psychotic features: Secondary | ICD-10-CM | POA: Diagnosis not present

## 2020-02-10 DIAGNOSIS — F509 Eating disorder, unspecified: Secondary | ICD-10-CM | POA: Diagnosis not present

## 2020-02-10 DIAGNOSIS — F411 Generalized anxiety disorder: Secondary | ICD-10-CM | POA: Diagnosis not present

## 2020-02-10 DIAGNOSIS — F410 Panic disorder [episodic paroxysmal anxiety] without agoraphobia: Secondary | ICD-10-CM | POA: Diagnosis not present

## 2020-02-10 MED FILL — PRAZOSIN 1 MG CAPSULE: 1 | 30 days supply | Qty: 30 | Fill #2

## 2020-02-15 ENCOUNTER — Other Ambulatory Visit (HOSPITAL_COMMUNITY): Payer: Self-pay

## 2020-02-15 MED FILL — ZIPRASIDONE HCL 60 MG CAP: 60 | 30 days supply | Qty: 60 | Fill #0

## 2020-02-15 MED FILL — traZODone HCL 100 MG TABS: 100 | 30 days supply | Qty: 30 | Fill #0

## 2020-02-17 MED FILL — PRAZOSIN 1 MG CAPSULE: 1 | 30 days supply | Qty: 90 | Fill #0

## 2020-02-22 MED FILL — PARoxetine HCL 20 MG TABS: 20 | 30 days supply | Qty: 30 | Fill #0

## 2020-03-08 DIAGNOSIS — L600 Ingrowing nail: Secondary | ICD-10-CM | POA: Diagnosis not present

## 2020-03-08 MED FILL — busPIRone HCL 15 MG TABS: 15 | 30 days supply | Qty: 60 | Fill #0

## 2020-03-16 ENCOUNTER — Other Ambulatory Visit (HOSPITAL_COMMUNITY): Payer: Self-pay | Admitting: Psychiatry

## 2020-03-18 DIAGNOSIS — R45851 Suicidal ideations: Secondary | ICD-10-CM | POA: Diagnosis not present

## 2020-03-18 DIAGNOSIS — F319 Bipolar disorder, unspecified: Secondary | ICD-10-CM | POA: Diagnosis not present

## 2020-03-18 DIAGNOSIS — F509 Eating disorder, unspecified: Secondary | ICD-10-CM | POA: Diagnosis not present

## 2020-03-18 DIAGNOSIS — Z811 Family history of alcohol abuse and dependence: Secondary | ICD-10-CM | POA: Diagnosis not present

## 2020-03-18 DIAGNOSIS — F332 Major depressive disorder, recurrent severe without psychotic features: Secondary | ICD-10-CM | POA: Diagnosis not present

## 2020-03-18 DIAGNOSIS — Z818 Family history of other mental and behavioral disorders: Secondary | ICD-10-CM | POA: Diagnosis not present

## 2020-03-18 DIAGNOSIS — F1520 Other stimulant dependence, uncomplicated: Secondary | ICD-10-CM | POA: Diagnosis not present

## 2020-03-18 DIAGNOSIS — J45909 Unspecified asthma, uncomplicated: Secondary | ICD-10-CM | POA: Diagnosis not present

## 2020-03-18 DIAGNOSIS — F502 Bulimia nervosa: Secondary | ICD-10-CM | POA: Diagnosis not present

## 2020-03-18 DIAGNOSIS — F4310 Post-traumatic stress disorder, unspecified: Secondary | ICD-10-CM | POA: Diagnosis not present

## 2020-03-18 DIAGNOSIS — Z720 Tobacco use: Secondary | ICD-10-CM | POA: Diagnosis not present

## 2020-03-18 DIAGNOSIS — Z7251 High risk heterosexual behavior: Secondary | ICD-10-CM | POA: Diagnosis not present

## 2020-03-18 DIAGNOSIS — F1210 Cannabis abuse, uncomplicated: Secondary | ICD-10-CM | POA: Diagnosis not present

## 2020-03-18 MED FILL — PRAZOSIN 1 MG CAPSULE: 1 | 30 days supply | Qty: 90 | Fill #0

## 2020-03-24 ENCOUNTER — Other Ambulatory Visit (HOSPITAL_COMMUNITY): Payer: Self-pay

## 2020-03-24 MED FILL — lamoTRIgine 25 MG TABS: 25 | 15 days supply | Qty: 30 | Fill #0

## 2020-03-24 MED FILL — busPIRone HCL 30 MG TABS: 30 | 15 days supply | Qty: 15 | Fill #0

## 2020-03-24 MED FILL — traZODone HCL 100 MG TABS: 100 | 15 days supply | Qty: 15 | Fill #0

## 2020-03-24 MED FILL — PARoxetine HCL 40 MG TABS: 40 | 15 days supply | Qty: 15 | Fill #0

## 2020-03-29 ENCOUNTER — Other Ambulatory Visit (HOSPITAL_COMMUNITY): Payer: Self-pay

## 2020-03-29 MED FILL — PARoxetine HCL 20 MG TABS: 20 | 30 days supply | Qty: 30 | Fill #0

## 2020-03-29 MED FILL — lamoTRIgine 25 MG TABS: 25 | 30 days supply | Qty: 60 | Fill #0

## 2020-04-01 MED FILL — busPIRone HCL 15 MG TABS: 15 | 30 days supply | Qty: 60 | Fill #0

## 2020-04-14 MED FILL — traZODone HCL 100 MG TABS: 100 | 30 days supply | Qty: 30 | Fill #0

## 2020-04-14 MED FILL — buPROPion HCL ER (XL) 150 M: 150 | 30 days supply | Qty: 30 | Fill #0

## 2020-04-18 MED FILL — PRAZOSIN 1 MG CAPSULE: 1 | 30 days supply | Qty: 90 | Fill #1

## 2020-04-25 MED FILL — lamoTRIgine 25 MG TABS: 25 | 30 days supply | Qty: 60 | Fill #0

## 2020-04-27 ENCOUNTER — Other Ambulatory Visit (HOSPITAL_COMMUNITY): Payer: Self-pay

## 2020-04-27 MED FILL — PARoxetine HCL 20 MG TABS: 20 | 30 days supply | Qty: 30 | Fill #0

## 2020-04-27 MED FILL — busPIRone HCL 15 MG TABS: 15 | 30 days supply | Qty: 60 | Fill #0

## 2020-04-28 ENCOUNTER — Other Ambulatory Visit (HOSPITAL_COMMUNITY): Payer: Self-pay | Admitting: Family Medicine

## 2020-04-28 DIAGNOSIS — Z3009 Encounter for other general counseling and advice on contraception: Secondary | ICD-10-CM | POA: Diagnosis not present

## 2020-04-28 DIAGNOSIS — Z3041 Encounter for surveillance of contraceptive pills: Secondary | ICD-10-CM | POA: Diagnosis not present

## 2020-04-28 DIAGNOSIS — Z23 Encounter for immunization: Secondary | ICD-10-CM | POA: Diagnosis not present

## 2020-05-10 ENCOUNTER — Other Ambulatory Visit (HOSPITAL_COMMUNITY): Payer: Self-pay

## 2020-05-10 MED FILL — traZODone HCL 100 MG TABS: 100 | 30 days supply | Qty: 30 | Fill #0

## 2020-05-10 MED FILL — buPROPion HCL ER (XL) 150 M: 150 | 30 days supply | Qty: 30 | Fill #1

## 2020-05-17 MED FILL — PRAZOSIN 1 MG CAPSULE: 1 | 30 days supply | Qty: 90 | Fill #2

## 2020-05-24 DIAGNOSIS — F411 Generalized anxiety disorder: Secondary | ICD-10-CM | POA: Diagnosis not present

## 2020-05-24 DIAGNOSIS — F410 Panic disorder [episodic paroxysmal anxiety] without agoraphobia: Secondary | ICD-10-CM | POA: Diagnosis not present

## 2020-05-24 DIAGNOSIS — F3164 Bipolar disorder, current episode mixed, severe, with psychotic features: Secondary | ICD-10-CM | POA: Diagnosis not present

## 2020-05-31 ENCOUNTER — Other Ambulatory Visit (HOSPITAL_COMMUNITY): Payer: Self-pay

## 2020-05-31 MED FILL — ZIPRASIDONE HCL 60 MG CAP: 60 | 30 days supply | Qty: 60 | Fill #0

## 2020-05-31 MED FILL — PARoxetine HCL 20 MG TABS: 20 | 30 days supply | Qty: 30 | Fill #0

## 2020-05-31 MED FILL — lamoTRIgine 25 MG TABS: 25 | 30 days supply | Qty: 60 | Fill #0

## 2020-06-06 ENCOUNTER — Other Ambulatory Visit (HOSPITAL_COMMUNITY): Payer: Self-pay

## 2020-06-06 MED FILL — buPROPion HCL ER (XL) 150 M: 150 | 30 days supply | Qty: 30 | Fill #2

## 2020-06-06 MED FILL — busPIRone HCL 15 MG TABS: 15 | 30 days supply | Qty: 60 | Fill #0

## 2020-06-06 MED FILL — traZODone HCL 100 MG TABS: 100 | 30 days supply | Qty: 30 | Fill #0

## 2020-06-11 DIAGNOSIS — J9602 Acute respiratory failure with hypercapnia: Secondary | ICD-10-CM | POA: Diagnosis not present

## 2020-06-11 DIAGNOSIS — R0689 Other abnormalities of breathing: Secondary | ICD-10-CM | POA: Diagnosis not present

## 2020-06-11 DIAGNOSIS — F339 Major depressive disorder, recurrent, unspecified: Secondary | ICD-10-CM | POA: Diagnosis not present

## 2020-06-11 DIAGNOSIS — R4020 Unspecified coma: Secondary | ICD-10-CM | POA: Diagnosis not present

## 2020-06-11 DIAGNOSIS — R339 Retention of urine, unspecified: Secondary | ICD-10-CM | POA: Diagnosis not present

## 2020-06-11 DIAGNOSIS — F1120 Opioid dependence, uncomplicated: Secondary | ICD-10-CM | POA: Diagnosis not present

## 2020-06-11 DIAGNOSIS — R000 Tachycardia, unspecified: Secondary | ICD-10-CM | POA: Diagnosis not present

## 2020-06-11 DIAGNOSIS — J9601 Acute respiratory failure with hypoxia: Secondary | ICD-10-CM | POA: Diagnosis not present

## 2020-06-11 DIAGNOSIS — N179 Acute kidney failure, unspecified: Secondary | ICD-10-CM | POA: Diagnosis not present

## 2020-06-11 DIAGNOSIS — J690 Pneumonitis due to inhalation of food and vomit: Secondary | ICD-10-CM | POA: Diagnosis not present

## 2020-06-11 DIAGNOSIS — Z20822 Contact with and (suspected) exposure to covid-19: Secondary | ICD-10-CM | POA: Diagnosis not present

## 2020-06-11 DIAGNOSIS — F319 Bipolar disorder, unspecified: Secondary | ICD-10-CM | POA: Diagnosis not present

## 2020-06-11 DIAGNOSIS — F1920 Other psychoactive substance dependence, uncomplicated: Secondary | ICD-10-CM | POA: Diagnosis not present

## 2020-06-11 DIAGNOSIS — T401X1S Poisoning by heroin, accidental (unintentional), sequela: Secondary | ICD-10-CM | POA: Diagnosis not present

## 2020-06-11 DIAGNOSIS — R0681 Apnea, not elsewhere classified: Secondary | ICD-10-CM | POA: Diagnosis not present

## 2020-06-11 DIAGNOSIS — T887XXA Unspecified adverse effect of drug or medicament, initial encounter: Secondary | ICD-10-CM | POA: Diagnosis not present

## 2020-06-11 DIAGNOSIS — F1110 Opioid abuse, uncomplicated: Secondary | ICD-10-CM | POA: Diagnosis not present

## 2020-06-11 DIAGNOSIS — T401X1A Poisoning by heroin, accidental (unintentional), initial encounter: Secondary | ICD-10-CM | POA: Diagnosis not present

## 2020-06-11 DIAGNOSIS — R0902 Hypoxemia: Secondary | ICD-10-CM | POA: Diagnosis not present

## 2020-06-11 DIAGNOSIS — T50904A Poisoning by unspecified drugs, medicaments and biological substances, undetermined, initial encounter: Secondary | ICD-10-CM | POA: Diagnosis not present

## 2020-06-14 MED FILL — PRAZOSIN 1 MG CAPSULE: 1 | 30 days supply | Qty: 90 | Fill #0

## 2020-06-17 ENCOUNTER — Other Ambulatory Visit: Payer: Self-pay | Admitting: *Deleted

## 2020-06-17 NOTE — Patient Outreach (Signed)
[facility]) Care Management  06/17/2020  Kristina Sawyer 27-Jul-2000 [id number]   Transition of care telephone call  Referral received:06/15/20 Initial outreach:06/17/20 Insurance: [facility]   Initial unsuccessful telephone call to patient's preferred number in order to complete transition of care assessment; no answer, left HIPAA compliant voicemail message requesting return call.   Objective: Per electronic record Kristina Sawyer  was hospitalized at [facility]  11/27- 06/14/20 for Hypoxia Accidental overdose of Heroin Comorbidities include: Generalized anxiety disorder , exercise induced asthma, major depressive disorder, substance abuse.  She  was discharged to home on 06/14/20 without the need for home health services or DME. .   Plan: This RNCM will route unsuccessful outreach letter with [facility] Care Management pamphlet and 24 hour Nurse Advice Line Magnet to [facility] Kristina Sawyer  was hospitalized at [facility]  11/27- 11/30 for Hypoxia Accidental overdose of Heroin Comorbidities include: Generalized anxiety disorder , exercise induced asthma, major depressive disorder, substance abuse.  She  was discharged to home on 06/14/20 without the need for home health services or DME. [facility] Care Management clinical pool to be mailed to patient's home address. This RNCM will attempt another outreach within 4 business days.  Egbert Garibaldi, RN, BSN  [facility] Care Management,Care Management Coordinator  [phone]- Mobile [phone]- Toll Free Main Office

## 2020-06-22 ENCOUNTER — Other Ambulatory Visit: Payer: Self-pay | Admitting: *Deleted

## 2020-06-22 NOTE — Patient Outreach (Signed)
[facility]) Care Management  06/22/2020  Kristina Sawyer 12/13/2000 [id number]   Transition of care call Referral received: 06/15/20 Initial outreach attempt: 06/17/20 Insurance: [employer]    2nd unsuccessful telephone call to patient's preferred contact number in order to complete post hospital discharge transition of care assessment , no answer mailbox full unable to leave a message    Objective: Per electronic record Kristina Sawyer was hospitalized at [facility]  11/27- 06/14/20 for Hypoxia Accidental overdose of Heroin Comorbidities include: Generalized anxiety disorder , exercise induced asthma, major depressive disorder, substance abuse.  She  was discharged to home on 06/14/20 without the need for home health servicesor DME.   Plan If no return call from patient will attempt 3rd outreach in the next 4 business days.   Egbert Garibaldi, RN, BSN  [facility] Care Management,Care Management Coordinator  [phone]- Mobile [phone]- Toll Free Main Office

## 2020-06-23 DIAGNOSIS — F3164 Bipolar disorder, current episode mixed, severe, with psychotic features: Secondary | ICD-10-CM | POA: Diagnosis not present

## 2020-06-23 DIAGNOSIS — F410 Panic disorder [episodic paroxysmal anxiety] without agoraphobia: Secondary | ICD-10-CM | POA: Diagnosis not present

## 2020-06-23 DIAGNOSIS — F411 Generalized anxiety disorder: Secondary | ICD-10-CM | POA: Diagnosis not present

## 2020-06-24 MED FILL — lamoTRIgine 25 MG TABS: 25 | 30 days supply | Qty: 60 | Fill #0

## 2020-06-27 ENCOUNTER — Other Ambulatory Visit (HOSPITAL_COMMUNITY): Payer: Self-pay

## 2020-06-27 ENCOUNTER — Other Ambulatory Visit: Payer: Self-pay | Admitting: *Deleted

## 2020-06-27 MED FILL — PARoxetine HCL 20 MG TABS: 20 | 30 days supply | Qty: 30 | Fill #0

## 2020-06-27 MED FILL — ZIPRASIDONE HCL 60 MG CAP: 60 | 30 days supply | Qty: 60 | Fill #0

## 2020-06-27 NOTE — Patient Outreach (Signed)
[facility]) Care Management  06/27/2020  Raea Resnik Apr 14, 2001 [id number]   Transition of care call Referral received: 06/15/20 Initial outreach attempt: 06/17/20 Insurance: [employer]   Third unsuccessful telephone call to patient's preferred contact number in order to complete post hospital discharge transition of care assessment; no answer, mailbox full unable to leave a message. Placed call to home number listed first  no answer able to leave a HIPAA compliant for return call.   Objective: Per electronic record Chole Compere was hospitalized at [facility]  11/27- 06/14/20 for Hypoxia Accidental overdose of Heroin Comorbidities include: Generalized anxiety disorder , exercise induced asthma, major depressive disorder, substance abuse.  She  was discharged to home on 06/14/20 without the need for home health servicesor DME.   Plan: If no return call from patient, will plan 4th call attempt in the next 3 weeks.   Egbert Garibaldi, RN, BSN  [facility] Care Management,Care Management Coordinator  [phone]- Mobile [phone]- Toll Free Main Office

## 2020-06-28 DIAGNOSIS — Z7141 Alcohol abuse counseling and surveillance of alcoholic: Secondary | ICD-10-CM | POA: Diagnosis not present

## 2020-06-28 DIAGNOSIS — F1120 Opioid dependence, uncomplicated: Secondary | ICD-10-CM | POA: Diagnosis not present

## 2020-06-28 DIAGNOSIS — Z87891 Personal history of nicotine dependence: Secondary | ICD-10-CM | POA: Diagnosis not present

## 2020-06-28 DIAGNOSIS — F1110 Opioid abuse, uncomplicated: Secondary | ICD-10-CM | POA: Diagnosis not present

## 2020-06-28 DIAGNOSIS — Z029 Encounter for administrative examinations, unspecified: Secondary | ICD-10-CM | POA: Diagnosis not present

## 2020-06-28 DIAGNOSIS — Z79899 Other long term (current) drug therapy: Secondary | ICD-10-CM | POA: Diagnosis not present

## 2020-06-28 DIAGNOSIS — Z720 Tobacco use: Secondary | ICD-10-CM | POA: Diagnosis not present

## 2020-06-30 MED FILL — traZODone HCL 100 MG TABS: 100 | 30 days supply | Qty: 30 | Fill #0

## 2020-06-30 MED FILL — busPIRone HCL 15 MG TABS: 15 | 30 days supply | Qty: 60 | Fill #0

## 2020-07-05 DIAGNOSIS — L600 Ingrowing nail: Secondary | ICD-10-CM | POA: Diagnosis not present

## 2020-07-05 DIAGNOSIS — Z720 Tobacco use: Secondary | ICD-10-CM | POA: Diagnosis not present

## 2020-07-05 DIAGNOSIS — F1110 Opioid abuse, uncomplicated: Secondary | ICD-10-CM | POA: Diagnosis not present

## 2020-07-05 DIAGNOSIS — F1120 Opioid dependence, uncomplicated: Secondary | ICD-10-CM | POA: Diagnosis not present

## 2020-07-05 DIAGNOSIS — Z79899 Other long term (current) drug therapy: Secondary | ICD-10-CM | POA: Diagnosis not present

## 2020-07-05 DIAGNOSIS — M79674 Pain in right toe(s): Secondary | ICD-10-CM | POA: Diagnosis not present

## 2020-07-05 DIAGNOSIS — M79675 Pain in left toe(s): Secondary | ICD-10-CM | POA: Diagnosis not present

## 2020-07-05 DIAGNOSIS — Z87891 Personal history of nicotine dependence: Secondary | ICD-10-CM | POA: Diagnosis not present

## 2020-07-11 MED FILL — PRAZOSIN 1 MG CAPSULE: 1 | 30 days supply | Qty: 90 | Fill #1

## 2020-07-12 DIAGNOSIS — Z79899 Other long term (current) drug therapy: Secondary | ICD-10-CM | POA: Diagnosis not present

## 2020-07-12 DIAGNOSIS — Z720 Tobacco use: Secondary | ICD-10-CM | POA: Diagnosis not present

## 2020-07-12 DIAGNOSIS — Z029 Encounter for administrative examinations, unspecified: Secondary | ICD-10-CM | POA: Diagnosis not present

## 2020-07-12 DIAGNOSIS — Z87891 Personal history of nicotine dependence: Secondary | ICD-10-CM | POA: Diagnosis not present

## 2020-07-12 DIAGNOSIS — F1120 Opioid dependence, uncomplicated: Secondary | ICD-10-CM | POA: Diagnosis not present

## 2020-07-12 DIAGNOSIS — F1110 Opioid abuse, uncomplicated: Secondary | ICD-10-CM | POA: Diagnosis not present

## 2020-07-12 DIAGNOSIS — Z7141 Alcohol abuse counseling and surveillance of alcoholic: Secondary | ICD-10-CM | POA: Diagnosis not present

## 2020-07-14 ENCOUNTER — Other Ambulatory Visit: Payer: Self-pay | Admitting: *Deleted

## 2020-07-14 NOTE — Patient Outreach (Signed)
[facility]) Care Management  07/14/2020  Kristina Sawyer 04-Jul-2001 [id number]   Transition of care /Case Closure Unsuccessful outreach    Referral received:06/15/20 Initial outreach:06/17/20 Insurance: [employer]    Unable to complete post hospital discharge transition of care assessment. No return call form patient after 3 call attempts and no response to request to contact RN Care Coordinator in unsuccessful outreach letter mailed to home on 06/17/20.  Objective: [facility] for Hypoxia Accidental overdose of Heroin Comorbidities include:Generalized anxiety disorder , exercise induced asthma, major depressive disorder, substance abuse.  Shewas discharged to home on 11/30/21without the need for home health servicesor DME. Plan Case closed to [facility] [employer] care management services, Unsuccessful call attempt x 4 .   Egbert Garibaldi, RN, BSN  [facility] Care Management,Care Management Coordinator  [phone]- Mobile [phone]- Toll Free Main Office

## 2020-07-19 DIAGNOSIS — Z7141 Alcohol abuse counseling and surveillance of alcoholic: Secondary | ICD-10-CM | POA: Diagnosis not present

## 2020-07-19 DIAGNOSIS — Z87891 Personal history of nicotine dependence: Secondary | ICD-10-CM | POA: Diagnosis not present

## 2020-07-19 DIAGNOSIS — Z79899 Other long term (current) drug therapy: Secondary | ICD-10-CM | POA: Diagnosis not present

## 2020-07-19 DIAGNOSIS — Z720 Tobacco use: Secondary | ICD-10-CM | POA: Diagnosis not present

## 2020-07-19 DIAGNOSIS — F1110 Opioid abuse, uncomplicated: Secondary | ICD-10-CM | POA: Diagnosis not present

## 2020-07-19 DIAGNOSIS — Z029 Encounter for administrative examinations, unspecified: Secondary | ICD-10-CM | POA: Diagnosis not present

## 2020-07-19 DIAGNOSIS — F1120 Opioid dependence, uncomplicated: Secondary | ICD-10-CM | POA: Diagnosis not present

## 2020-07-25 MED FILL — lamoTRIgine 25 MG TABS: 25 | 15 days supply | Qty: 30 | Fill #0

## 2020-07-26 DIAGNOSIS — Z87891 Personal history of nicotine dependence: Secondary | ICD-10-CM | POA: Diagnosis not present

## 2020-07-26 DIAGNOSIS — Z720 Tobacco use: Secondary | ICD-10-CM | POA: Diagnosis not present

## 2020-07-26 DIAGNOSIS — F1110 Opioid abuse, uncomplicated: Secondary | ICD-10-CM | POA: Diagnosis not present

## 2020-07-26 DIAGNOSIS — F1210 Cannabis abuse, uncomplicated: Secondary | ICD-10-CM | POA: Diagnosis not present

## 2020-07-26 DIAGNOSIS — F1120 Opioid dependence, uncomplicated: Secondary | ICD-10-CM | POA: Diagnosis not present

## 2020-07-26 DIAGNOSIS — F1620 Hallucinogen dependence, uncomplicated: Secondary | ICD-10-CM | POA: Diagnosis not present

## 2020-07-26 DIAGNOSIS — F1420 Cocaine dependence, uncomplicated: Secondary | ICD-10-CM | POA: Diagnosis not present

## 2020-07-26 DIAGNOSIS — F17210 Nicotine dependence, cigarettes, uncomplicated: Secondary | ICD-10-CM | POA: Diagnosis not present

## 2020-07-26 DIAGNOSIS — F3113 Bipolar disorder, current episode manic without psychotic features, severe: Secondary | ICD-10-CM | POA: Diagnosis not present

## 2020-07-26 DIAGNOSIS — Z79899 Other long term (current) drug therapy: Secondary | ICD-10-CM | POA: Diagnosis not present

## 2020-08-02 DIAGNOSIS — Z720 Tobacco use: Secondary | ICD-10-CM | POA: Diagnosis not present

## 2020-08-02 DIAGNOSIS — F1210 Cannabis abuse, uncomplicated: Secondary | ICD-10-CM | POA: Diagnosis not present

## 2020-08-02 DIAGNOSIS — F17210 Nicotine dependence, cigarettes, uncomplicated: Secondary | ICD-10-CM | POA: Diagnosis not present

## 2020-08-02 DIAGNOSIS — Z87891 Personal history of nicotine dependence: Secondary | ICD-10-CM | POA: Diagnosis not present

## 2020-08-02 DIAGNOSIS — F1120 Opioid dependence, uncomplicated: Secondary | ICD-10-CM | POA: Diagnosis not present

## 2020-08-02 DIAGNOSIS — Z79899 Other long term (current) drug therapy: Secondary | ICD-10-CM | POA: Diagnosis not present

## 2020-08-02 DIAGNOSIS — F1110 Opioid abuse, uncomplicated: Secondary | ICD-10-CM | POA: Diagnosis not present

## 2020-08-08 MED FILL — PRAZOSIN 1 MG CAPSULE: 1 | 30 days supply | Qty: 90 | Fill #2

## 2020-08-08 MED FILL — lamoTRIgine 25 MG TABS: 25 | 30 days supply | Qty: 60 | Fill #0

## 2020-08-16 DIAGNOSIS — F17210 Nicotine dependence, cigarettes, uncomplicated: Secondary | ICD-10-CM | POA: Diagnosis not present

## 2020-08-16 DIAGNOSIS — F1110 Opioid abuse, uncomplicated: Secondary | ICD-10-CM | POA: Diagnosis not present

## 2020-08-16 DIAGNOSIS — F1420 Cocaine dependence, uncomplicated: Secondary | ICD-10-CM | POA: Diagnosis not present

## 2020-08-16 DIAGNOSIS — F1210 Cannabis abuse, uncomplicated: Secondary | ICD-10-CM | POA: Diagnosis not present

## 2020-08-16 DIAGNOSIS — Z720 Tobacco use: Secondary | ICD-10-CM | POA: Diagnosis not present

## 2020-08-16 DIAGNOSIS — Z87891 Personal history of nicotine dependence: Secondary | ICD-10-CM | POA: Diagnosis not present

## 2020-08-16 DIAGNOSIS — F1120 Opioid dependence, uncomplicated: Secondary | ICD-10-CM | POA: Diagnosis not present

## 2020-09-06 DIAGNOSIS — F1420 Cocaine dependence, uncomplicated: Secondary | ICD-10-CM | POA: Diagnosis not present

## 2020-09-06 DIAGNOSIS — F1120 Opioid dependence, uncomplicated: Secondary | ICD-10-CM | POA: Diagnosis not present

## 2020-09-06 DIAGNOSIS — Z87891 Personal history of nicotine dependence: Secondary | ICD-10-CM | POA: Diagnosis not present

## 2020-09-06 DIAGNOSIS — F17210 Nicotine dependence, cigarettes, uncomplicated: Secondary | ICD-10-CM | POA: Diagnosis not present

## 2020-09-06 DIAGNOSIS — F1110 Opioid abuse, uncomplicated: Secondary | ICD-10-CM | POA: Diagnosis not present

## 2020-09-06 DIAGNOSIS — F1210 Cannabis abuse, uncomplicated: Secondary | ICD-10-CM | POA: Diagnosis not present

## 2020-09-06 DIAGNOSIS — Z720 Tobacco use: Secondary | ICD-10-CM | POA: Diagnosis not present

## 2020-09-12 ENCOUNTER — Other Ambulatory Visit (HOSPITAL_COMMUNITY): Payer: Self-pay

## 2020-09-12 MED FILL — busPIRone HCL 15 MG TABS: 15 | 30 days supply | Qty: 60 | Fill #0

## 2020-09-12 MED FILL — ZIPRASIDONE HCL 60 MG CAP: 60 | 30 days supply | Qty: 30 | Fill #0

## 2020-09-12 MED FILL — PRAZOSIN 1 MG CAPSULE: 1 | 30 days supply | Qty: 90 | Fill #0

## 2020-09-12 MED FILL — lamoTRIgine 25 MG TABS: 25 | 30 days supply | Qty: 90 | Fill #0

## 2020-09-12 MED FILL — traZODone HCL 100 MG TABS: 100 | 30 days supply | Qty: 30 | Fill #0

## 2020-09-12 MED FILL — PARoxetine HCL 40 MG TABS: 40 | 30 days supply | Qty: 30 | Fill #0

## 2020-09-13 DIAGNOSIS — F17210 Nicotine dependence, cigarettes, uncomplicated: Secondary | ICD-10-CM | POA: Diagnosis not present

## 2020-09-13 DIAGNOSIS — Z720 Tobacco use: Secondary | ICD-10-CM | POA: Diagnosis not present

## 2020-09-13 DIAGNOSIS — F1210 Cannabis abuse, uncomplicated: Secondary | ICD-10-CM | POA: Diagnosis not present

## 2020-09-13 DIAGNOSIS — F1110 Opioid abuse, uncomplicated: Secondary | ICD-10-CM | POA: Diagnosis not present

## 2020-09-13 DIAGNOSIS — F1420 Cocaine dependence, uncomplicated: Secondary | ICD-10-CM | POA: Diagnosis not present

## 2020-09-13 DIAGNOSIS — Z87891 Personal history of nicotine dependence: Secondary | ICD-10-CM | POA: Diagnosis not present

## 2020-09-13 DIAGNOSIS — F1120 Opioid dependence, uncomplicated: Secondary | ICD-10-CM | POA: Diagnosis not present

## 2020-09-21 DIAGNOSIS — Z87891 Personal history of nicotine dependence: Secondary | ICD-10-CM | POA: Diagnosis not present

## 2020-09-21 DIAGNOSIS — F17210 Nicotine dependence, cigarettes, uncomplicated: Secondary | ICD-10-CM | POA: Diagnosis not present

## 2020-09-21 DIAGNOSIS — Z720 Tobacco use: Secondary | ICD-10-CM | POA: Diagnosis not present

## 2020-09-21 DIAGNOSIS — F1110 Opioid abuse, uncomplicated: Secondary | ICD-10-CM | POA: Diagnosis not present

## 2020-09-21 DIAGNOSIS — F1120 Opioid dependence, uncomplicated: Secondary | ICD-10-CM | POA: Diagnosis not present

## 2020-09-21 DIAGNOSIS — F1210 Cannabis abuse, uncomplicated: Secondary | ICD-10-CM | POA: Diagnosis not present

## 2020-09-21 DIAGNOSIS — Z79899 Other long term (current) drug therapy: Secondary | ICD-10-CM | POA: Diagnosis not present

## 2020-09-21 DIAGNOSIS — F1420 Cocaine dependence, uncomplicated: Secondary | ICD-10-CM | POA: Diagnosis not present

## 2020-09-27 ENCOUNTER — Other Ambulatory Visit (HOSPITAL_COMMUNITY): Payer: Self-pay

## 2020-09-28 DIAGNOSIS — F17210 Nicotine dependence, cigarettes, uncomplicated: Secondary | ICD-10-CM | POA: Diagnosis not present

## 2020-09-28 DIAGNOSIS — Z79899 Other long term (current) drug therapy: Secondary | ICD-10-CM | POA: Diagnosis not present

## 2020-09-28 DIAGNOSIS — F1210 Cannabis abuse, uncomplicated: Secondary | ICD-10-CM | POA: Diagnosis not present

## 2020-09-28 DIAGNOSIS — F1420 Cocaine dependence, uncomplicated: Secondary | ICD-10-CM | POA: Diagnosis not present

## 2020-09-28 DIAGNOSIS — F1110 Opioid abuse, uncomplicated: Secondary | ICD-10-CM | POA: Diagnosis not present

## 2020-09-28 DIAGNOSIS — Z720 Tobacco use: Secondary | ICD-10-CM | POA: Diagnosis not present

## 2020-09-28 DIAGNOSIS — Z87891 Personal history of nicotine dependence: Secondary | ICD-10-CM | POA: Diagnosis not present

## 2020-09-28 DIAGNOSIS — F1120 Opioid dependence, uncomplicated: Secondary | ICD-10-CM | POA: Diagnosis not present

## 2020-10-04 DIAGNOSIS — F411 Generalized anxiety disorder: Secondary | ICD-10-CM | POA: Diagnosis not present

## 2020-10-04 DIAGNOSIS — F3164 Bipolar disorder, current episode mixed, severe, with psychotic features: Secondary | ICD-10-CM | POA: Diagnosis not present

## 2020-10-05 MED FILL — PHEXXI 1.8-1-0.4 % GEL: 1.8-1-0.4 | 12 days supply | Qty: 60 | Fill #0

## 2020-10-06 DIAGNOSIS — F1210 Cannabis abuse, uncomplicated: Secondary | ICD-10-CM | POA: Diagnosis not present

## 2020-10-06 DIAGNOSIS — F17210 Nicotine dependence, cigarettes, uncomplicated: Secondary | ICD-10-CM | POA: Diagnosis not present

## 2020-10-06 DIAGNOSIS — Z720 Tobacco use: Secondary | ICD-10-CM | POA: Diagnosis not present

## 2020-10-06 DIAGNOSIS — F1420 Cocaine dependence, uncomplicated: Secondary | ICD-10-CM | POA: Diagnosis not present

## 2020-10-06 DIAGNOSIS — Z87891 Personal history of nicotine dependence: Secondary | ICD-10-CM | POA: Diagnosis not present

## 2020-10-06 DIAGNOSIS — Z79899 Other long term (current) drug therapy: Secondary | ICD-10-CM | POA: Diagnosis not present

## 2020-10-06 DIAGNOSIS — F1120 Opioid dependence, uncomplicated: Secondary | ICD-10-CM | POA: Diagnosis not present

## 2020-10-06 DIAGNOSIS — F1110 Opioid abuse, uncomplicated: Secondary | ICD-10-CM | POA: Diagnosis not present

## 2020-10-10 MED FILL — busPIRone HCL 15 MG TABS: 15 | 30 days supply | Qty: 60 | Fill #0

## 2020-10-10 MED FILL — lamoTRIgine 25 MG TABS: 25 | 30 days supply | Qty: 90 | Fill #0

## 2020-10-10 MED FILL — PRAZOSIN 1 MG CAPSULE: 1 | 30 days supply | Qty: 90 | Fill #1

## 2020-10-10 MED FILL — PARoxetine HCL 40 MG TABS: 40 | 30 days supply | Qty: 30 | Fill #1

## 2020-10-10 MED FILL — traZODone HCL 100 MG TABS: 100 | 30 days supply | Qty: 30 | Fill #0

## 2020-10-20 DIAGNOSIS — F1120 Opioid dependence, uncomplicated: Secondary | ICD-10-CM | POA: Diagnosis not present

## 2020-10-20 DIAGNOSIS — F1620 Hallucinogen dependence, uncomplicated: Secondary | ICD-10-CM | POA: Diagnosis not present

## 2020-10-20 DIAGNOSIS — F1210 Cannabis abuse, uncomplicated: Secondary | ICD-10-CM | POA: Diagnosis not present

## 2020-10-20 DIAGNOSIS — F1110 Opioid abuse, uncomplicated: Secondary | ICD-10-CM | POA: Diagnosis not present

## 2020-10-20 DIAGNOSIS — Z720 Tobacco use: Secondary | ICD-10-CM | POA: Diagnosis not present

## 2020-10-20 DIAGNOSIS — F1420 Cocaine dependence, uncomplicated: Secondary | ICD-10-CM | POA: Diagnosis not present

## 2020-10-20 DIAGNOSIS — F17210 Nicotine dependence, cigarettes, uncomplicated: Secondary | ICD-10-CM | POA: Diagnosis not present

## 2020-10-20 DIAGNOSIS — Z87891 Personal history of nicotine dependence: Secondary | ICD-10-CM | POA: Diagnosis not present

## 2020-10-20 DIAGNOSIS — Z79899 Other long term (current) drug therapy: Secondary | ICD-10-CM | POA: Diagnosis not present

## 2020-10-25 ENCOUNTER — Other Ambulatory Visit (HOSPITAL_COMMUNITY): Payer: Self-pay

## 2020-10-28 ENCOUNTER — Other Ambulatory Visit (HOSPITAL_COMMUNITY): Payer: Self-pay

## 2020-10-29 ENCOUNTER — Other Ambulatory Visit (HOSPITAL_COMMUNITY): Payer: Self-pay

## 2020-11-01 ENCOUNTER — Other Ambulatory Visit (HOSPITAL_COMMUNITY): Payer: Self-pay

## 2020-11-03 DIAGNOSIS — Z720 Tobacco use: Secondary | ICD-10-CM | POA: Diagnosis not present

## 2020-11-03 DIAGNOSIS — F1120 Opioid dependence, uncomplicated: Secondary | ICD-10-CM | POA: Diagnosis not present

## 2020-11-03 DIAGNOSIS — F1620 Hallucinogen dependence, uncomplicated: Secondary | ICD-10-CM | POA: Diagnosis not present

## 2020-11-03 DIAGNOSIS — F17210 Nicotine dependence, cigarettes, uncomplicated: Secondary | ICD-10-CM | POA: Diagnosis not present

## 2020-11-03 DIAGNOSIS — F1210 Cannabis abuse, uncomplicated: Secondary | ICD-10-CM | POA: Diagnosis not present

## 2020-11-03 DIAGNOSIS — F1110 Opioid abuse, uncomplicated: Secondary | ICD-10-CM | POA: Diagnosis not present

## 2020-11-03 DIAGNOSIS — Z87891 Personal history of nicotine dependence: Secondary | ICD-10-CM | POA: Diagnosis not present

## 2020-11-03 DIAGNOSIS — F1420 Cocaine dependence, uncomplicated: Secondary | ICD-10-CM | POA: Diagnosis not present

## 2020-11-03 DIAGNOSIS — Z79899 Other long term (current) drug therapy: Secondary | ICD-10-CM | POA: Diagnosis not present

## 2020-11-04 ENCOUNTER — Other Ambulatory Visit (HOSPITAL_COMMUNITY): Payer: Self-pay

## 2020-11-05 ENCOUNTER — Other Ambulatory Visit (HOSPITAL_COMMUNITY): Payer: Self-pay

## 2020-11-05 MED FILL — Bupropion HCl Tab ER 24HR 150 MG: ORAL | 30 days supply | Qty: 30 | Fill #0 | Status: AC

## 2020-11-05 MED FILL — Prazosin HCl Cap 1 MG: ORAL | 30 days supply | Qty: 90 | Fill #0 | Status: AC

## 2020-11-05 MED FILL — Paroxetine HCl Tab 40 MG: ORAL | 30 days supply | Qty: 30 | Fill #0 | Status: AC

## 2020-11-07 ENCOUNTER — Other Ambulatory Visit (HOSPITAL_COMMUNITY): Payer: Self-pay

## 2020-11-08 ENCOUNTER — Other Ambulatory Visit (HOSPITAL_COMMUNITY): Payer: Self-pay

## 2020-11-08 DIAGNOSIS — F3113 Bipolar disorder, current episode manic without psychotic features, severe: Secondary | ICD-10-CM | POA: Diagnosis not present

## 2020-11-08 DIAGNOSIS — F1420 Cocaine dependence, uncomplicated: Secondary | ICD-10-CM | POA: Diagnosis not present

## 2020-11-08 DIAGNOSIS — F1120 Opioid dependence, uncomplicated: Secondary | ICD-10-CM | POA: Diagnosis not present

## 2020-11-08 DIAGNOSIS — F1210 Cannabis abuse, uncomplicated: Secondary | ICD-10-CM | POA: Diagnosis not present

## 2020-11-08 DIAGNOSIS — F1620 Hallucinogen dependence, uncomplicated: Secondary | ICD-10-CM | POA: Diagnosis not present

## 2020-11-08 DIAGNOSIS — Z87891 Personal history of nicotine dependence: Secondary | ICD-10-CM | POA: Diagnosis not present

## 2020-11-09 ENCOUNTER — Other Ambulatory Visit (HOSPITAL_COMMUNITY): Payer: Self-pay

## 2020-11-11 ENCOUNTER — Other Ambulatory Visit (HOSPITAL_COMMUNITY): Payer: Self-pay

## 2020-11-16 ENCOUNTER — Other Ambulatory Visit (HOSPITAL_COMMUNITY): Payer: Self-pay

## 2020-11-17 ENCOUNTER — Other Ambulatory Visit (HOSPITAL_COMMUNITY): Payer: Self-pay

## 2020-11-17 DIAGNOSIS — Z720 Tobacco use: Secondary | ICD-10-CM | POA: Diagnosis not present

## 2020-11-17 DIAGNOSIS — F1110 Opioid abuse, uncomplicated: Secondary | ICD-10-CM | POA: Diagnosis not present

## 2020-11-17 DIAGNOSIS — Z79899 Other long term (current) drug therapy: Secondary | ICD-10-CM | POA: Diagnosis not present

## 2020-11-17 DIAGNOSIS — F1120 Opioid dependence, uncomplicated: Secondary | ICD-10-CM | POA: Diagnosis not present

## 2020-11-17 DIAGNOSIS — Z87891 Personal history of nicotine dependence: Secondary | ICD-10-CM | POA: Diagnosis not present

## 2020-11-17 DIAGNOSIS — F17210 Nicotine dependence, cigarettes, uncomplicated: Secondary | ICD-10-CM | POA: Diagnosis not present

## 2020-11-21 ENCOUNTER — Other Ambulatory Visit (HOSPITAL_COMMUNITY): Payer: Self-pay

## 2020-11-22 ENCOUNTER — Other Ambulatory Visit (HOSPITAL_COMMUNITY): Payer: Self-pay

## 2020-11-22 MED ORDER — TRAZODONE HCL 100 MG PO TABS
100.0000 mg | ORAL_TABLET | Freq: Every day | ORAL | 0 refills | Status: DC
  Filled 2020-11-22: qty 30, 30d supply, fill #0

## 2020-11-22 MED ORDER — ZIPRASIDONE HCL 60 MG PO CAPS
60.0000 mg | ORAL_CAPSULE | Freq: Every day | ORAL | 0 refills | Status: DC
  Filled 2020-11-22: qty 30, 30d supply, fill #0

## 2020-11-22 MED ORDER — PRAZOSIN HCL 1 MG PO CAPS
3.0000 mg | ORAL_CAPSULE | Freq: Every evening | ORAL | 2 refills | Status: AC
  Filled 2020-11-22: qty 90, 30d supply, fill #0

## 2020-11-22 MED ORDER — BUSPIRONE HCL 15 MG PO TABS
30.0000 mg | ORAL_TABLET | Freq: Every day | ORAL | 0 refills | Status: DC
  Filled 2020-11-22: qty 60, 30d supply, fill #0

## 2020-11-22 MED ORDER — PAROXETINE HCL 40 MG PO TABS
40.0000 mg | ORAL_TABLET | Freq: Every day | ORAL | 2 refills | Status: AC
  Filled 2020-11-22: qty 30, 30d supply, fill #0

## 2020-11-22 MED ORDER — BUPROPION HCL ER (XL) 150 MG PO TB24
150.0000 mg | ORAL_TABLET | Freq: Every morning | ORAL | 2 refills | Status: AC
  Filled 2020-11-22: qty 30, 30d supply, fill #0

## 2020-12-01 DIAGNOSIS — F1110 Opioid abuse, uncomplicated: Secondary | ICD-10-CM | POA: Diagnosis not present

## 2020-12-01 DIAGNOSIS — F1120 Opioid dependence, uncomplicated: Secondary | ICD-10-CM | POA: Diagnosis not present

## 2020-12-01 DIAGNOSIS — F1210 Cannabis abuse, uncomplicated: Secondary | ICD-10-CM | POA: Diagnosis not present

## 2020-12-01 DIAGNOSIS — F1620 Hallucinogen dependence, uncomplicated: Secondary | ICD-10-CM | POA: Diagnosis not present

## 2020-12-01 DIAGNOSIS — Z87891 Personal history of nicotine dependence: Secondary | ICD-10-CM | POA: Diagnosis not present

## 2020-12-01 DIAGNOSIS — Z79899 Other long term (current) drug therapy: Secondary | ICD-10-CM | POA: Diagnosis not present

## 2020-12-01 DIAGNOSIS — Z720 Tobacco use: Secondary | ICD-10-CM | POA: Diagnosis not present

## 2020-12-01 DIAGNOSIS — F1420 Cocaine dependence, uncomplicated: Secondary | ICD-10-CM | POA: Diagnosis not present

## 2020-12-01 DIAGNOSIS — F17210 Nicotine dependence, cigarettes, uncomplicated: Secondary | ICD-10-CM | POA: Diagnosis not present

## 2020-12-02 ENCOUNTER — Other Ambulatory Visit (HOSPITAL_COMMUNITY): Payer: Self-pay

## 2020-12-02 MED ORDER — ZUBSOLV 5.7-1.4 MG SL SUBL
SUBLINGUAL_TABLET | Freq: Two times a day (BID) | SUBLINGUAL | 0 refills | Status: AC
  Filled 2020-12-02: qty 28, 14d supply, fill #0
  Filled 2020-12-02: qty 15, 7d supply, fill #0
  Filled 2020-12-02 – 2020-12-05 (×3): qty 28, 14d supply, fill #0

## 2020-12-05 ENCOUNTER — Other Ambulatory Visit (HOSPITAL_COMMUNITY): Payer: Self-pay

## 2020-12-07 ENCOUNTER — Other Ambulatory Visit (HOSPITAL_COMMUNITY): Payer: Self-pay

## 2020-12-09 ENCOUNTER — Other Ambulatory Visit (HOSPITAL_COMMUNITY): Payer: Self-pay

## 2020-12-13 ENCOUNTER — Other Ambulatory Visit (HOSPITAL_COMMUNITY): Payer: Self-pay

## 2020-12-13 MED ORDER — CARESTART COVID-19 HOME TEST VI KIT
PACK | 0 refills | Status: AC
  Filled 2020-12-13: qty 4, 4d supply, fill #0

## 2020-12-16 ENCOUNTER — Other Ambulatory Visit (HOSPITAL_COMMUNITY): Payer: Self-pay

## 2020-12-20 ENCOUNTER — Other Ambulatory Visit (HOSPITAL_COMMUNITY): Payer: Self-pay

## 2020-12-20 DIAGNOSIS — Z9189 Other specified personal risk factors, not elsewhere classified: Secondary | ICD-10-CM | POA: Diagnosis not present

## 2020-12-20 DIAGNOSIS — Z114 Encounter for screening for human immunodeficiency virus [HIV]: Secondary | ICD-10-CM | POA: Diagnosis not present

## 2020-12-20 DIAGNOSIS — Z1159 Encounter for screening for other viral diseases: Secondary | ICD-10-CM | POA: Diagnosis not present

## 2020-12-20 DIAGNOSIS — F1120 Opioid dependence, uncomplicated: Secondary | ICD-10-CM | POA: Diagnosis not present

## 2020-12-21 ENCOUNTER — Other Ambulatory Visit (HOSPITAL_COMMUNITY): Payer: Self-pay

## 2020-12-21 MED ORDER — ZUBSOLV 5.7-1.4 MG SL SUBL
1.0000 | SUBLINGUAL_TABLET | Freq: Two times a day (BID) | SUBLINGUAL | 0 refills | Status: AC
  Filled 2020-12-21 – 2020-12-22 (×2): qty 14, 7d supply, fill #0

## 2020-12-21 MED ORDER — ZUBSOLV 5.7-1.4 MG SL SUBL
SUBLINGUAL_TABLET | SUBLINGUAL | 0 refills | Status: AC
  Filled 2020-12-21 – 2020-12-28 (×5): qty 14, 7d supply, fill #0

## 2020-12-22 ENCOUNTER — Other Ambulatory Visit (HOSPITAL_COMMUNITY): Payer: Self-pay

## 2020-12-28 ENCOUNTER — Other Ambulatory Visit (HOSPITAL_COMMUNITY): Payer: Self-pay

## 2020-12-29 ENCOUNTER — Other Ambulatory Visit (HOSPITAL_COMMUNITY): Payer: Self-pay

## 2021-01-04 ENCOUNTER — Other Ambulatory Visit (HOSPITAL_COMMUNITY): Payer: Self-pay

## 2021-01-04 DIAGNOSIS — F3164 Bipolar disorder, current episode mixed, severe, with psychotic features: Secondary | ICD-10-CM | POA: Diagnosis not present

## 2021-01-04 DIAGNOSIS — F411 Generalized anxiety disorder: Secondary | ICD-10-CM | POA: Diagnosis not present

## 2021-01-09 ENCOUNTER — Other Ambulatory Visit (HOSPITAL_COMMUNITY): Payer: Self-pay

## 2021-01-09 MED ORDER — BUSPIRONE HCL 15 MG PO TABS
30.0000 mg | ORAL_TABLET | Freq: Every evening | ORAL | 0 refills | Status: AC
  Filled 2021-01-09: qty 60, 30d supply, fill #0

## 2021-01-09 MED ORDER — ZIPRASIDONE HCL 60 MG PO CAPS
60.0000 mg | ORAL_CAPSULE | Freq: Every evening | ORAL | 0 refills | Status: DC
  Filled 2021-01-09: qty 30, 30d supply, fill #0

## 2021-01-09 MED ORDER — TRAZODONE HCL 100 MG PO TABS
100.0000 mg | ORAL_TABLET | Freq: Every day | ORAL | 0 refills | Status: DC
  Filled 2021-01-09: qty 30, 30d supply, fill #0

## 2021-01-10 DIAGNOSIS — F1120 Opioid dependence, uncomplicated: Secondary | ICD-10-CM | POA: Diagnosis not present

## 2021-01-10 DIAGNOSIS — F319 Bipolar disorder, unspecified: Secondary | ICD-10-CM | POA: Diagnosis not present

## 2021-01-28 ENCOUNTER — Other Ambulatory Visit (HOSPITAL_COMMUNITY): Payer: Self-pay

## 2021-01-28 MED FILL — Bupropion HCl Tab ER 24HR 150 MG: ORAL | 30 days supply | Qty: 30 | Fill #1 | Status: AC

## 2021-01-30 ENCOUNTER — Other Ambulatory Visit (HOSPITAL_COMMUNITY): Payer: Self-pay

## 2021-01-30 DIAGNOSIS — F319 Bipolar disorder, unspecified: Secondary | ICD-10-CM | POA: Diagnosis not present

## 2021-01-30 DIAGNOSIS — F329 Major depressive disorder, single episode, unspecified: Secondary | ICD-10-CM | POA: Diagnosis not present

## 2021-01-30 DIAGNOSIS — F411 Generalized anxiety disorder: Secondary | ICD-10-CM | POA: Diagnosis not present

## 2021-01-30 DIAGNOSIS — F1120 Opioid dependence, uncomplicated: Secondary | ICD-10-CM | POA: Diagnosis not present

## 2021-01-30 MED ORDER — ZUBSOLV 5.7-1.4 MG SL SUBL
SUBLINGUAL_TABLET | SUBLINGUAL | 0 refills | Status: DC
  Filled 2021-01-30: qty 14, 7d supply, fill #0

## 2021-01-31 ENCOUNTER — Other Ambulatory Visit (HOSPITAL_COMMUNITY): Payer: Self-pay

## 2021-02-01 ENCOUNTER — Other Ambulatory Visit (HOSPITAL_COMMUNITY): Payer: Self-pay

## 2021-02-03 ENCOUNTER — Other Ambulatory Visit (HOSPITAL_COMMUNITY): Payer: Self-pay

## 2021-02-03 MED ORDER — PRAZOSIN HCL 1 MG PO CAPS
3.0000 mg | ORAL_CAPSULE | Freq: Every evening | ORAL | 2 refills | Status: AC
  Filled 2021-02-03: qty 90, 30d supply, fill #0

## 2021-02-03 MED ORDER — PAROXETINE HCL 40 MG PO TABS
40.0000 mg | ORAL_TABLET | Freq: Every day | ORAL | 2 refills | Status: AC
  Filled 2021-02-03: qty 30, 30d supply, fill #0
  Filled 2021-03-12: qty 30, 30d supply, fill #1

## 2021-02-03 MED ORDER — BUSPIRONE HCL 15 MG PO TABS
15.0000 mg | ORAL_TABLET | Freq: Two times a day (BID) | ORAL | 0 refills | Status: DC | PRN
  Filled 2021-02-03: qty 60, 30d supply, fill #0

## 2021-02-06 ENCOUNTER — Other Ambulatory Visit (HOSPITAL_COMMUNITY): Payer: Self-pay

## 2021-02-07 ENCOUNTER — Other Ambulatory Visit (HOSPITAL_COMMUNITY): Payer: Self-pay

## 2021-02-13 DIAGNOSIS — F319 Bipolar disorder, unspecified: Secondary | ICD-10-CM | POA: Diagnosis not present

## 2021-02-13 DIAGNOSIS — F1120 Opioid dependence, uncomplicated: Secondary | ICD-10-CM | POA: Diagnosis not present

## 2021-02-19 ENCOUNTER — Other Ambulatory Visit (HOSPITAL_COMMUNITY): Payer: Self-pay

## 2021-02-20 ENCOUNTER — Other Ambulatory Visit (HOSPITAL_COMMUNITY): Payer: Self-pay

## 2021-02-21 ENCOUNTER — Other Ambulatory Visit (HOSPITAL_COMMUNITY): Payer: Self-pay

## 2021-02-21 DIAGNOSIS — F319 Bipolar disorder, unspecified: Secondary | ICD-10-CM | POA: Diagnosis not present

## 2021-02-21 DIAGNOSIS — F1120 Opioid dependence, uncomplicated: Secondary | ICD-10-CM | POA: Diagnosis not present

## 2021-02-22 ENCOUNTER — Other Ambulatory Visit (HOSPITAL_COMMUNITY): Payer: Self-pay

## 2021-02-24 ENCOUNTER — Other Ambulatory Visit (HOSPITAL_COMMUNITY): Payer: Self-pay

## 2021-02-28 ENCOUNTER — Other Ambulatory Visit (HOSPITAL_COMMUNITY): Payer: Self-pay

## 2021-02-28 MED ORDER — TRAZODONE HCL 100 MG PO TABS
100.0000 mg | ORAL_TABLET | Freq: Every day | ORAL | 0 refills | Status: DC
  Filled 2021-02-28: qty 30, 30d supply, fill #0

## 2021-02-28 MED ORDER — LAMOTRIGINE 25 MG PO TABS
75.0000 mg | ORAL_TABLET | Freq: Every day | ORAL | 0 refills | Status: DC
  Filled 2021-02-28: qty 90, 30d supply, fill #0

## 2021-02-28 MED ORDER — PRAZOSIN HCL 1 MG PO CAPS
3.0000 mg | ORAL_CAPSULE | Freq: Every evening | ORAL | 2 refills | Status: AC
  Filled 2021-02-28: qty 90, 30d supply, fill #0

## 2021-02-28 MED ORDER — ZIPRASIDONE HCL 60 MG PO CAPS
60.0000 mg | ORAL_CAPSULE | Freq: Every day | ORAL | 0 refills | Status: DC
  Filled 2021-02-28: qty 30, 30d supply, fill #0

## 2021-02-28 MED ORDER — BUPROPION HCL ER (XL) 150 MG PO TB24
150.0000 mg | ORAL_TABLET | Freq: Every morning | ORAL | 2 refills | Status: AC
  Filled 2021-02-28: qty 30, 30d supply, fill #0

## 2021-02-28 MED ORDER — BUSPIRONE HCL 15 MG PO TABS
15.0000 mg | ORAL_TABLET | Freq: Two times a day (BID) | ORAL | 0 refills | Status: DC | PRN
  Filled 2021-02-28: qty 60, 30d supply, fill #0

## 2021-03-07 DIAGNOSIS — F319 Bipolar disorder, unspecified: Secondary | ICD-10-CM | POA: Diagnosis not present

## 2021-03-07 DIAGNOSIS — F1120 Opioid dependence, uncomplicated: Secondary | ICD-10-CM | POA: Diagnosis not present

## 2021-03-13 ENCOUNTER — Other Ambulatory Visit (HOSPITAL_COMMUNITY): Payer: Self-pay

## 2021-03-21 DIAGNOSIS — F1120 Opioid dependence, uncomplicated: Secondary | ICD-10-CM | POA: Diagnosis not present

## 2021-03-21 DIAGNOSIS — F319 Bipolar disorder, unspecified: Secondary | ICD-10-CM | POA: Diagnosis not present

## 2021-03-23 ENCOUNTER — Other Ambulatory Visit (HOSPITAL_COMMUNITY): Payer: Self-pay

## 2021-03-29 ENCOUNTER — Other Ambulatory Visit (HOSPITAL_COMMUNITY): Payer: Self-pay

## 2021-03-30 DIAGNOSIS — F319 Bipolar disorder, unspecified: Secondary | ICD-10-CM | POA: Diagnosis not present

## 2021-04-04 DIAGNOSIS — F1120 Opioid dependence, uncomplicated: Secondary | ICD-10-CM | POA: Diagnosis not present

## 2021-04-04 DIAGNOSIS — F319 Bipolar disorder, unspecified: Secondary | ICD-10-CM | POA: Diagnosis not present

## 2021-04-07 ENCOUNTER — Other Ambulatory Visit (HOSPITAL_COMMUNITY): Payer: Self-pay

## 2021-04-07 MED FILL — Paroxetine HCl Tab 40 MG: ORAL | 30 days supply | Qty: 30 | Fill #0 | Status: AC

## 2021-04-25 ENCOUNTER — Other Ambulatory Visit (HOSPITAL_COMMUNITY): Payer: Self-pay

## 2021-04-25 MED ORDER — CARESTART COVID-19 HOME TEST VI KIT
PACK | 0 refills | Status: AC
  Filled 2021-04-25: qty 4, 4d supply, fill #0

## 2021-04-26 ENCOUNTER — Other Ambulatory Visit (HOSPITAL_COMMUNITY): Payer: Self-pay

## 2021-04-26 MED ORDER — BUPROPION HCL ER (XL) 150 MG PO TB24
150.0000 mg | ORAL_TABLET | Freq: Every morning | ORAL | 2 refills | Status: AC
  Filled 2021-04-26: qty 30, 30d supply, fill #0

## 2021-04-26 MED ORDER — ZIPRASIDONE HCL 60 MG PO CAPS
60.0000 mg | ORAL_CAPSULE | Freq: Every evening | ORAL | 0 refills | Status: AC
  Filled 2021-04-26: qty 30, 30d supply, fill #0

## 2021-04-26 MED ORDER — PRAZOSIN HCL 1 MG PO CAPS
3.0000 mg | ORAL_CAPSULE | Freq: Every evening | ORAL | 2 refills | Status: AC
  Filled 2021-04-26: qty 90, 30d supply, fill #0

## 2021-04-26 MED ORDER — BUSPIRONE HCL 15 MG PO TABS
15.0000 mg | ORAL_TABLET | Freq: Two times a day (BID) | ORAL | 0 refills | Status: DC | PRN
  Filled 2021-04-26: qty 60, 30d supply, fill #0

## 2021-04-26 MED ORDER — LAMOTRIGINE 25 MG PO TABS
75.0000 mg | ORAL_TABLET | Freq: Every evening | ORAL | 0 refills | Status: AC
  Filled 2021-04-26: qty 90, 30d supply, fill #0

## 2021-04-26 MED ORDER — TRAZODONE HCL 100 MG PO TABS
100.0000 mg | ORAL_TABLET | Freq: Every day | ORAL | 0 refills | Status: AC
  Filled 2021-04-26: qty 30, 30d supply, fill #0

## 2021-04-26 MED ORDER — PAROXETINE HCL 40 MG PO TABS
40.0000 mg | ORAL_TABLET | Freq: Every day | ORAL | 2 refills | Status: AC
  Filled 2021-04-26: qty 30, 30d supply, fill #0

## 2021-05-02 DIAGNOSIS — F1120 Opioid dependence, uncomplicated: Secondary | ICD-10-CM | POA: Diagnosis not present

## 2021-05-02 DIAGNOSIS — F319 Bipolar disorder, unspecified: Secondary | ICD-10-CM | POA: Diagnosis not present

## 2021-05-04 IMAGING — CT CT ABD-PELV W/ CM
2 of 4 series · 16 of 46 positions shown, 18 images · IV contrast (APPLIED)
Comparison: None.

CLINICAL DATA: Unintended weight loss with nonlocalized abdominal
pain

EXAM:
CT ABDOMEN AND PELVIS WITH CONTRAST
TECHNIQUE: Multidetector CT imaging of the abdomen and pelvis was performed
using the standard protocol following bolus administration of
intravenous contrast.
CONTRAST:  100mL OMNIPAQUE IOHEXOL 300 MG/ML  SOLN

[Series 3: abdomen 5.0 · axial · 0.75mm/px · z∈[-636,-246]mm · 13 of 90 slices shown, 15 images]
[im 6/90  soft-tissue]
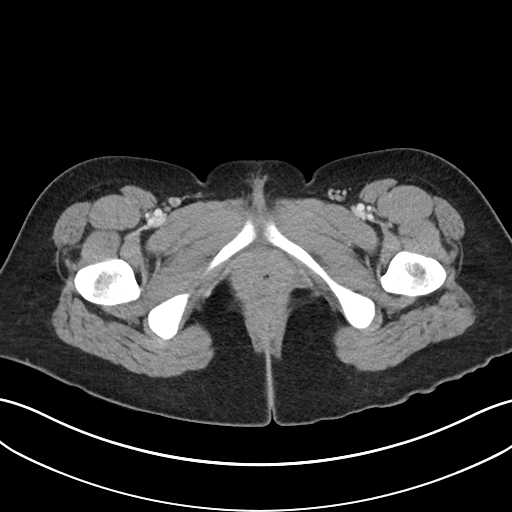
[im 6/90  bone]
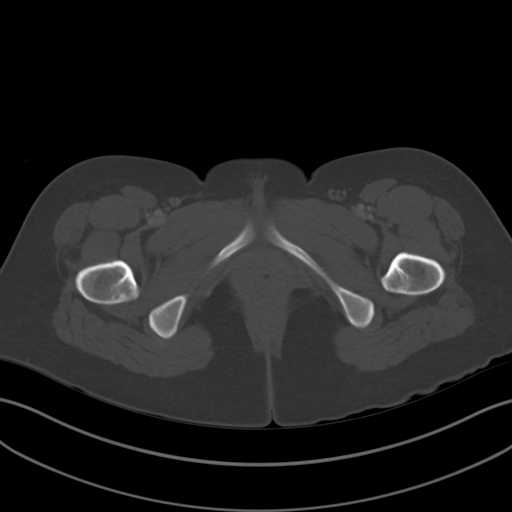
[im 11/90  soft-tissue]
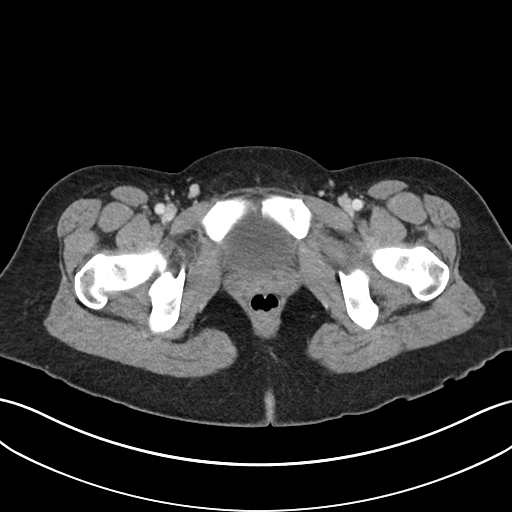
[im 21/90  soft-tissue]
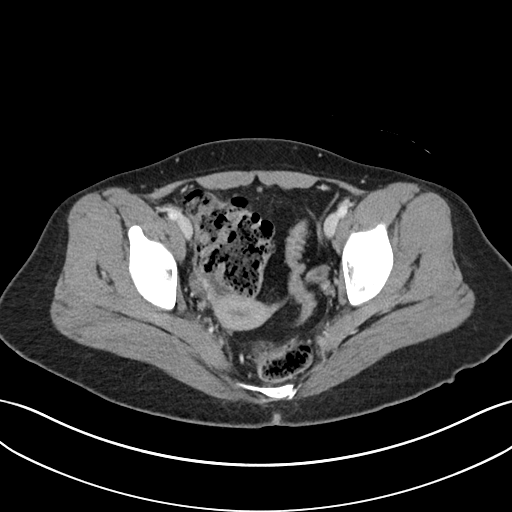
[im 27/90  soft-tissue]
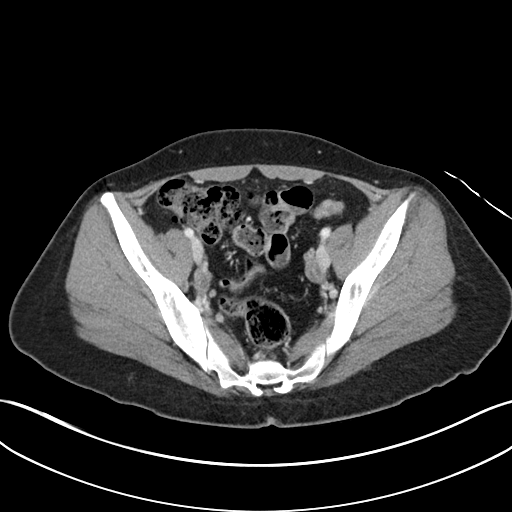
[im 32/90  soft-tissue]
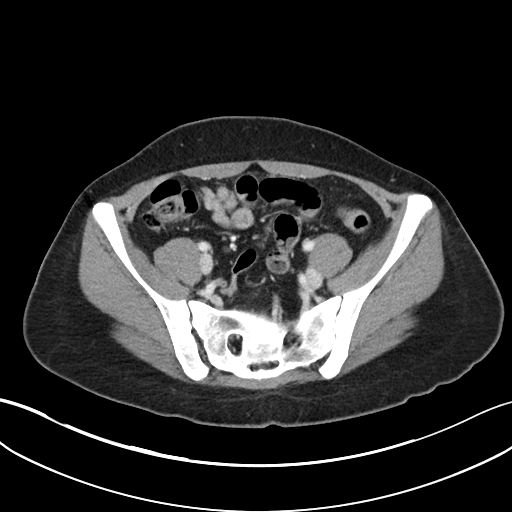
[im 37/90  soft-tissue]
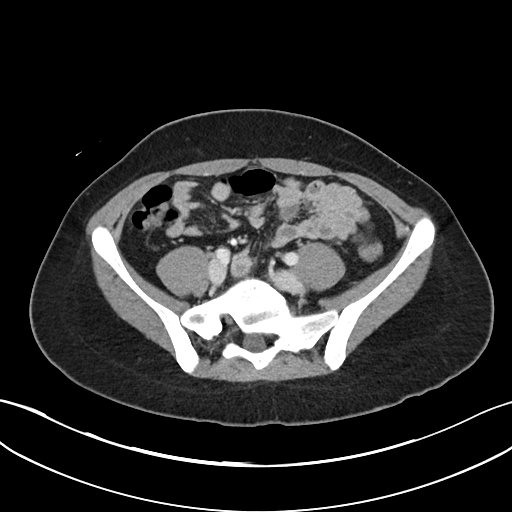
[im 48/90  soft-tissue]
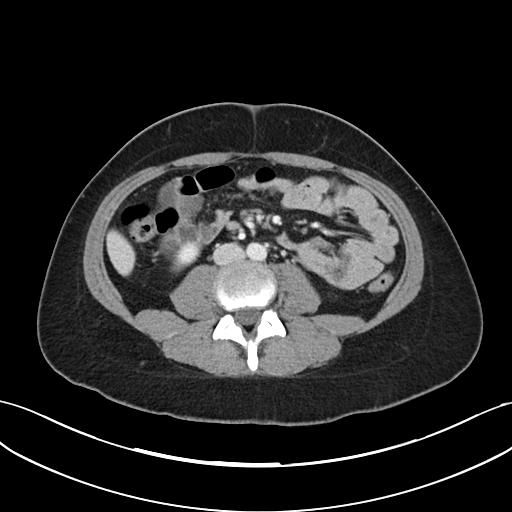
[im 53/90  soft-tissue]
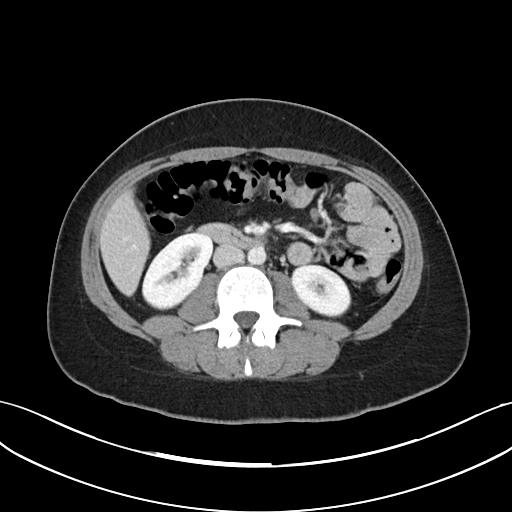
[im 58/90  soft-tissue]
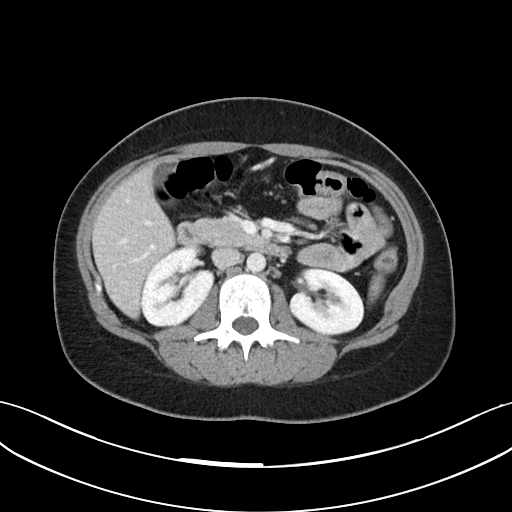
[im 58/90  bone]
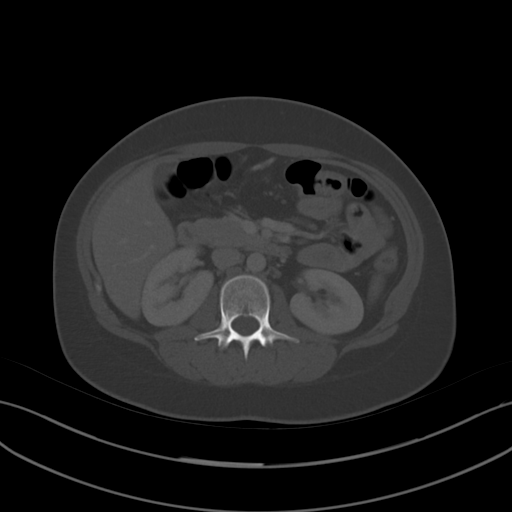
[im 63/90  soft-tissue]
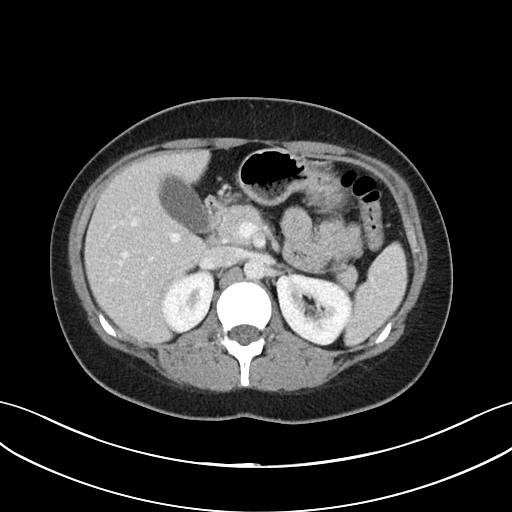
[im 69/90  soft-tissue]
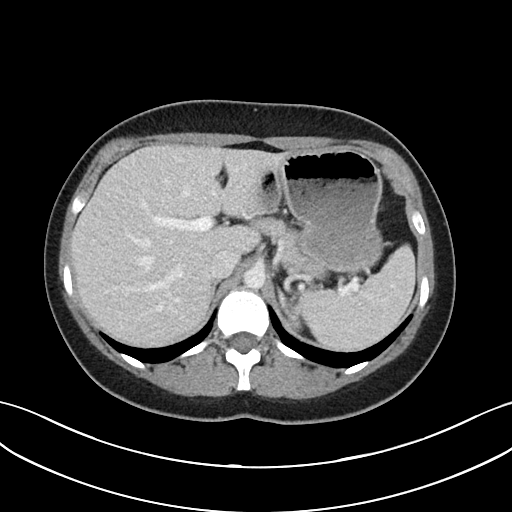
[im 79/90  soft-tissue]
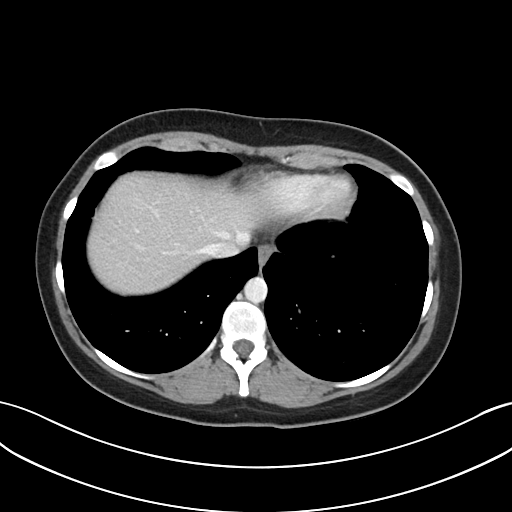
[im 84/90  soft-tissue]
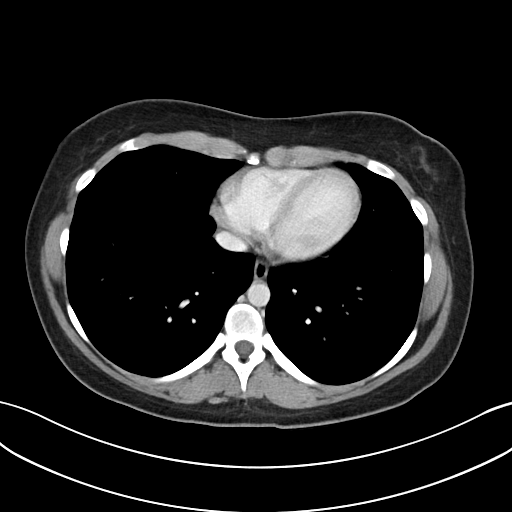

[Series 6: abdomen 3.0 mpr cor · coronal · 0.62mm/px · 3 of 86 slices shown]
[im 29/86  soft-tissue]
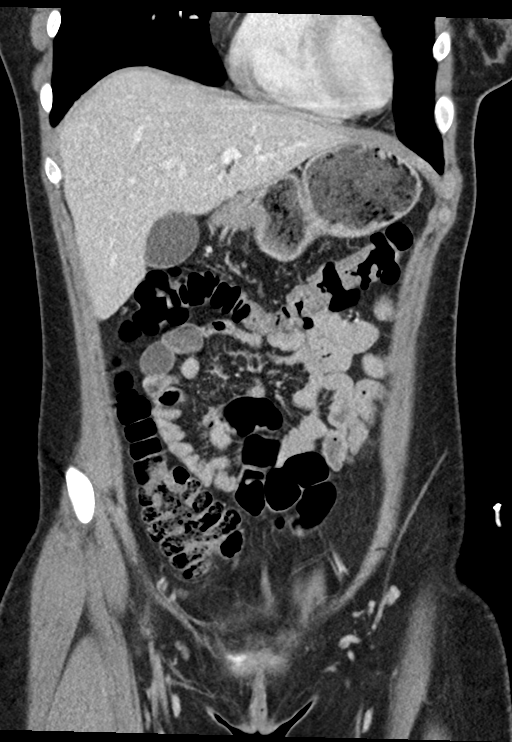
[im 38/86  soft-tissue]
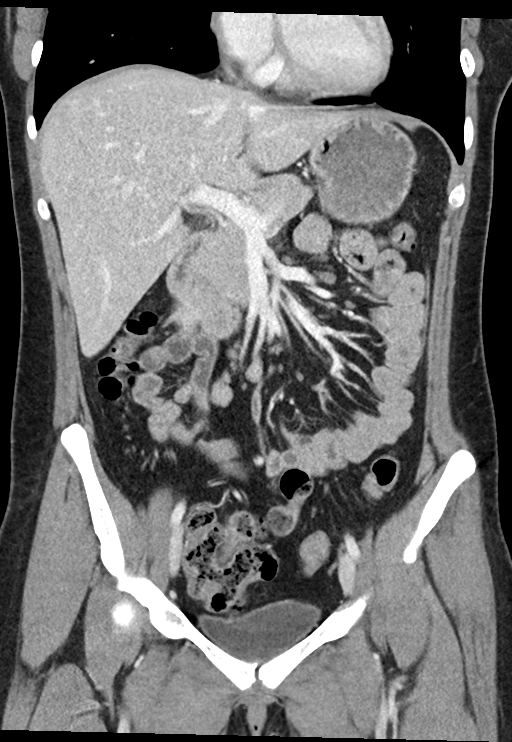
[im 48/86  soft-tissue]
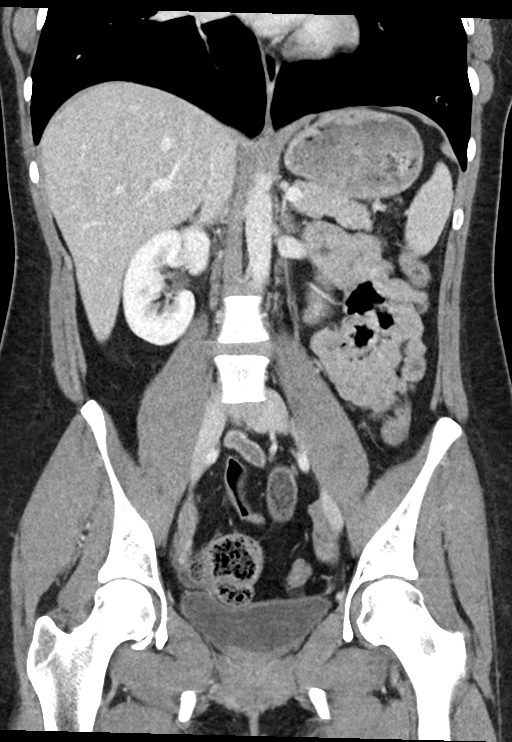

[16 of 46 positions shown; findings below may reference images not displayed]

FINDINGS: Lower chest:  No contributory findings.

Hepatobiliary: No focal liver abnormality.No evidence of biliary
obstruction or stone.

Pancreas: Unremarkable.

Spleen: Unremarkable.

Adrenals/Urinary Tract: Negative adrenals. No hydronephrosis or
stone. Unremarkable bladder.

Stomach/Bowel:  No obstruction. No appendicitis.

Vascular/Lymphatic: No acute vascular abnormality. No mass or
adenopathy.

Reproductive:No pathologic findings.

Other: No ascites or pneumoperitoneum.

Musculoskeletal: No acute abnormalities. Incomplete segmentation
from the transitional lumbosacral vertebra from the sacrum on the
right
IMPRESSION: Negative.  No explanation for abdominal pain.

## 2021-05-08 DIAGNOSIS — F3164 Bipolar disorder, current episode mixed, severe, with psychotic features: Secondary | ICD-10-CM | POA: Diagnosis not present

## 2021-05-08 DIAGNOSIS — F411 Generalized anxiety disorder: Secondary | ICD-10-CM | POA: Diagnosis not present

## 2021-05-30 DIAGNOSIS — F329 Major depressive disorder, single episode, unspecified: Secondary | ICD-10-CM | POA: Diagnosis not present

## 2021-05-30 DIAGNOSIS — F319 Bipolar disorder, unspecified: Secondary | ICD-10-CM | POA: Diagnosis not present

## 2021-05-30 DIAGNOSIS — F1120 Opioid dependence, uncomplicated: Secondary | ICD-10-CM | POA: Diagnosis not present

## 2021-05-30 DIAGNOSIS — F411 Generalized anxiety disorder: Secondary | ICD-10-CM | POA: Diagnosis not present

## 2021-06-07 DIAGNOSIS — F411 Generalized anxiety disorder: Secondary | ICD-10-CM | POA: Diagnosis not present

## 2021-06-07 DIAGNOSIS — F3164 Bipolar disorder, current episode mixed, severe, with psychotic features: Secondary | ICD-10-CM | POA: Diagnosis not present

## 2021-06-27 ENCOUNTER — Other Ambulatory Visit (HOSPITAL_COMMUNITY): Payer: Self-pay

## 2021-06-27 DIAGNOSIS — F1120 Opioid dependence, uncomplicated: Secondary | ICD-10-CM | POA: Diagnosis not present

## 2021-06-27 DIAGNOSIS — F319 Bipolar disorder, unspecified: Secondary | ICD-10-CM | POA: Diagnosis not present

## 2021-06-27 DIAGNOSIS — F411 Generalized anxiety disorder: Secondary | ICD-10-CM | POA: Diagnosis not present

## 2021-06-27 DIAGNOSIS — F329 Major depressive disorder, single episode, unspecified: Secondary | ICD-10-CM | POA: Diagnosis not present

## 2021-06-27 MED ORDER — BUPRENORPHINE HCL-NALOXONE HCL 8-2 MG SL SUBL
1.0000 | SUBLINGUAL_TABLET | Freq: Two times a day (BID) | SUBLINGUAL | 0 refills | Status: DC
  Filled 2021-06-27: qty 56, 28d supply, fill #0

## 2021-06-29 DIAGNOSIS — F3164 Bipolar disorder, current episode mixed, severe, with psychotic features: Secondary | ICD-10-CM | POA: Diagnosis not present

## 2021-06-29 DIAGNOSIS — F411 Generalized anxiety disorder: Secondary | ICD-10-CM | POA: Diagnosis not present

## 2021-07-03 DIAGNOSIS — H66003 Acute suppurative otitis media without spontaneous rupture of ear drum, bilateral: Secondary | ICD-10-CM | POA: Diagnosis not present

## 2021-07-25 ENCOUNTER — Other Ambulatory Visit (HOSPITAL_COMMUNITY): Payer: Self-pay

## 2021-07-25 DIAGNOSIS — F319 Bipolar disorder, unspecified: Secondary | ICD-10-CM | POA: Diagnosis not present

## 2021-07-25 DIAGNOSIS — F411 Generalized anxiety disorder: Secondary | ICD-10-CM | POA: Diagnosis not present

## 2021-07-25 DIAGNOSIS — F1120 Opioid dependence, uncomplicated: Secondary | ICD-10-CM | POA: Diagnosis not present

## 2021-07-25 DIAGNOSIS — F329 Major depressive disorder, single episode, unspecified: Secondary | ICD-10-CM | POA: Diagnosis not present

## 2021-07-25 MED ORDER — BUPRENORPHINE HCL-NALOXONE HCL 8-2 MG SL SUBL
1.0000 | SUBLINGUAL_TABLET | Freq: Two times a day (BID) | SUBLINGUAL | 0 refills | Status: DC
  Filled 2021-07-25: qty 56, 28d supply, fill #0

## 2021-08-28 ENCOUNTER — Other Ambulatory Visit (HOSPITAL_COMMUNITY): Payer: Self-pay

## 2021-08-28 DIAGNOSIS — F411 Generalized anxiety disorder: Secondary | ICD-10-CM | POA: Diagnosis not present

## 2021-08-28 DIAGNOSIS — F1120 Opioid dependence, uncomplicated: Secondary | ICD-10-CM | POA: Diagnosis not present

## 2021-08-28 DIAGNOSIS — F329 Major depressive disorder, single episode, unspecified: Secondary | ICD-10-CM | POA: Diagnosis not present

## 2021-08-28 DIAGNOSIS — F319 Bipolar disorder, unspecified: Secondary | ICD-10-CM | POA: Diagnosis not present

## 2021-08-28 MED ORDER — BUPRENORPHINE HCL-NALOXONE HCL 8-2 MG SL SUBL
1.0000 | SUBLINGUAL_TABLET | Freq: Two times a day (BID) | SUBLINGUAL | 0 refills | Status: DC
  Filled 2021-08-28: qty 56, 28d supply, fill #0

## 2021-10-03 DIAGNOSIS — F3164 Bipolar disorder, current episode mixed, severe, with psychotic features: Secondary | ICD-10-CM | POA: Diagnosis not present

## 2021-10-03 DIAGNOSIS — F411 Generalized anxiety disorder: Secondary | ICD-10-CM | POA: Diagnosis not present

## 2021-10-06 DIAGNOSIS — F329 Major depressive disorder, single episode, unspecified: Secondary | ICD-10-CM | POA: Diagnosis not present

## 2021-10-06 DIAGNOSIS — F1120 Opioid dependence, uncomplicated: Secondary | ICD-10-CM | POA: Diagnosis not present

## 2021-10-06 DIAGNOSIS — F319 Bipolar disorder, unspecified: Secondary | ICD-10-CM | POA: Diagnosis not present

## 2021-10-06 DIAGNOSIS — F411 Generalized anxiety disorder: Secondary | ICD-10-CM | POA: Diagnosis not present

## 2021-10-08 ENCOUNTER — Other Ambulatory Visit (HOSPITAL_COMMUNITY): Payer: Self-pay

## 2021-10-09 ENCOUNTER — Other Ambulatory Visit (HOSPITAL_COMMUNITY): Payer: Self-pay

## 2021-10-09 MED ORDER — BUSPIRONE HCL 15 MG PO TABS
15.0000 mg | ORAL_TABLET | Freq: Two times a day (BID) | ORAL | 0 refills | Status: AC | PRN
  Filled 2021-10-09: qty 60, 30d supply, fill #0

## 2021-10-09 MED ORDER — BUPRENORPHINE HCL-NALOXONE HCL 8-2 MG SL SUBL
1.0000 | SUBLINGUAL_TABLET | Freq: Two times a day (BID) | SUBLINGUAL | 0 refills | Status: DC
  Filled 2021-10-09: qty 56, 28d supply, fill #0

## 2021-11-03 DIAGNOSIS — F1120 Opioid dependence, uncomplicated: Secondary | ICD-10-CM | POA: Diagnosis not present

## 2021-11-14 ENCOUNTER — Other Ambulatory Visit (HOSPITAL_COMMUNITY): Payer: Self-pay

## 2021-11-14 MED ORDER — BUPRENORPHINE HCL-NALOXONE HCL 8-2 MG SL SUBL
SUBLINGUAL_TABLET | SUBLINGUAL | 0 refills | Status: AC
  Filled 2021-11-14: qty 56, 28d supply, fill #0

## 2021-11-15 ENCOUNTER — Other Ambulatory Visit (HOSPITAL_COMMUNITY): Payer: Self-pay

## 2021-11-20 ENCOUNTER — Other Ambulatory Visit (HOSPITAL_COMMUNITY): Payer: Self-pay

## 2021-11-20 MED ORDER — BUPRENORPHINE HCL-NALOXONE HCL 8-2 MG SL SUBL
1.0000 | SUBLINGUAL_TABLET | Freq: Every day | SUBLINGUAL | 0 refills | Status: AC
  Filled 2021-11-20: qty 28, 28d supply, fill #0
  Filled 2021-12-13: qty 28, 14d supply, fill #0

## 2021-11-21 ENCOUNTER — Other Ambulatory Visit (HOSPITAL_COMMUNITY): Payer: Self-pay

## 2021-12-01 ENCOUNTER — Other Ambulatory Visit (HOSPITAL_COMMUNITY): Payer: Self-pay

## 2021-12-01 DIAGNOSIS — F319 Bipolar disorder, unspecified: Secondary | ICD-10-CM | POA: Diagnosis not present

## 2021-12-01 DIAGNOSIS — F1120 Opioid dependence, uncomplicated: Secondary | ICD-10-CM | POA: Diagnosis not present

## 2021-12-01 DIAGNOSIS — F329 Major depressive disorder, single episode, unspecified: Secondary | ICD-10-CM | POA: Diagnosis not present

## 2021-12-01 DIAGNOSIS — F411 Generalized anxiety disorder: Secondary | ICD-10-CM | POA: Diagnosis not present

## 2021-12-01 MED ORDER — BUPRENORPHINE HCL-NALOXONE HCL 8-2 MG SL SUBL: 1.0000 | SUBLINGUAL_TABLET | Freq: Two times a day (BID) | SUBLINGUAL | 0 refills | Status: AC

## 2021-12-05 ENCOUNTER — Other Ambulatory Visit (HOSPITAL_COMMUNITY): Payer: Self-pay

## 2021-12-13 ENCOUNTER — Other Ambulatory Visit (HOSPITAL_COMMUNITY): Payer: Self-pay

## 2021-12-14 ENCOUNTER — Other Ambulatory Visit (HOSPITAL_COMMUNITY): Payer: Self-pay

## 2021-12-15 ENCOUNTER — Other Ambulatory Visit (HOSPITAL_COMMUNITY): Payer: Self-pay

## 2021-12-26 ENCOUNTER — Other Ambulatory Visit (HOSPITAL_COMMUNITY): Payer: Self-pay

## 2021-12-26 DIAGNOSIS — F411 Generalized anxiety disorder: Secondary | ICD-10-CM | POA: Diagnosis not present

## 2021-12-26 DIAGNOSIS — F3164 Bipolar disorder, current episode mixed, severe, with psychotic features: Secondary | ICD-10-CM | POA: Diagnosis not present

## 2021-12-26 MED ORDER — PAROXETINE HCL 40 MG PO TABS
40.0000 mg | ORAL_TABLET | Freq: Every day | ORAL | 2 refills | Status: AC
  Filled 2021-12-26: qty 30, 30d supply, fill #0

## 2021-12-26 MED ORDER — BUPROPION HCL ER (XL) 150 MG PO TB24
150.0000 mg | ORAL_TABLET | Freq: Every morning | ORAL | 2 refills | Status: AC
  Filled 2021-12-26: qty 30, 30d supply, fill #0

## 2021-12-26 MED ORDER — ZIPRASIDONE HCL 40 MG PO CAPS
40.0000 mg | ORAL_CAPSULE | Freq: Every evening | ORAL | 0 refills | Status: DC
  Filled 2021-12-26: qty 30, 30d supply, fill #0

## 2021-12-26 MED ORDER — TRAZODONE HCL 100 MG PO TABS
100.0000 mg | ORAL_TABLET | Freq: Every evening | ORAL | 2 refills | Status: AC | PRN
  Filled 2021-12-26: qty 30, 30d supply, fill #0

## 2022-01-12 ENCOUNTER — Other Ambulatory Visit (HOSPITAL_COMMUNITY): Payer: Self-pay

## 2022-01-12 DIAGNOSIS — F329 Major depressive disorder, single episode, unspecified: Secondary | ICD-10-CM | POA: Diagnosis not present

## 2022-01-12 DIAGNOSIS — F411 Generalized anxiety disorder: Secondary | ICD-10-CM | POA: Diagnosis not present

## 2022-01-12 DIAGNOSIS — F1120 Opioid dependence, uncomplicated: Secondary | ICD-10-CM | POA: Diagnosis not present

## 2022-01-12 DIAGNOSIS — F319 Bipolar disorder, unspecified: Secondary | ICD-10-CM | POA: Diagnosis not present

## 2022-01-12 MED ORDER — BUPRENORPHINE HCL-NALOXONE HCL 8-2 MG SL SUBL
1.0000 | SUBLINGUAL_TABLET | Freq: Two times a day (BID) | SUBLINGUAL | 0 refills | Status: AC
  Filled 2022-01-12: qty 56, 28d supply, fill #0

## 2022-01-18 ENCOUNTER — Other Ambulatory Visit (HOSPITAL_COMMUNITY): Payer: Self-pay

## 2022-01-19 ENCOUNTER — Other Ambulatory Visit (HOSPITAL_COMMUNITY): Payer: Self-pay

## 2022-03-01 ENCOUNTER — Other Ambulatory Visit (HOSPITAL_COMMUNITY): Payer: Self-pay

## 2022-03-01 MED ORDER — BUPRENORPHINE HCL-NALOXONE HCL 8-2 MG SL SUBL
1.0000 | SUBLINGUAL_TABLET | Freq: Two times a day (BID) | SUBLINGUAL | 0 refills | Status: AC
  Filled 2022-03-01: qty 56, 28d supply, fill #0

## 2022-03-02 ENCOUNTER — Other Ambulatory Visit (HOSPITAL_COMMUNITY): Payer: Self-pay

## 2022-03-05 ENCOUNTER — Other Ambulatory Visit (HOSPITAL_COMMUNITY): Payer: Self-pay

## 2022-03-07 ENCOUNTER — Other Ambulatory Visit (HOSPITAL_COMMUNITY): Payer: Self-pay

## 2022-03-07 MED ORDER — PAROXETINE HCL 40 MG PO TABS
40.0000 mg | ORAL_TABLET | Freq: Every day | ORAL | 2 refills | Status: AC
  Filled 2022-03-07: qty 30, 30d supply, fill #0

## 2022-03-07 MED ORDER — BUPROPION HCL ER (XL) 150 MG PO TB24
ORAL_TABLET | ORAL | 2 refills | Status: AC
  Filled 2022-03-07: qty 30, 30d supply, fill #0

## 2022-03-07 MED ORDER — TRAZODONE HCL 100 MG PO TABS
ORAL_TABLET | ORAL | 2 refills | Status: AC
Start: 2022-03-07 — End: ?
  Filled 2022-03-07: qty 30, 30d supply, fill #0

## 2022-03-07 MED ORDER — ZIPRASIDONE HCL 40 MG PO CAPS
ORAL_CAPSULE | ORAL | 0 refills | Status: AC
  Filled 2022-03-07: qty 30, 30d supply, fill #0

## 2022-03-08 ENCOUNTER — Other Ambulatory Visit (HOSPITAL_COMMUNITY): Payer: Self-pay

## 2022-03-28 ENCOUNTER — Other Ambulatory Visit (HOSPITAL_COMMUNITY): Payer: Self-pay

## 2022-03-28 MED ORDER — BUSPIRONE HCL 15 MG PO TABS
ORAL_TABLET | ORAL | 2 refills | Status: AC
  Filled 2022-03-28: qty 60, 30d supply, fill #0

## 2022-04-02 ENCOUNTER — Other Ambulatory Visit (HOSPITAL_COMMUNITY): Payer: Self-pay

## 2022-04-02 MED ORDER — BUPRENORPHINE HCL-NALOXONE HCL 8-2 MG SL SUBL
1.0000 | SUBLINGUAL_TABLET | Freq: Two times a day (BID) | SUBLINGUAL | 0 refills | Status: AC
  Filled 2022-04-02: qty 34, 17d supply, fill #0
  Filled 2022-04-04: qty 22, 11d supply, fill #0

## 2022-04-02 MED ORDER — BUPRENORPHINE HCL-NALOXONE HCL 8-2 MG SL SUBL
1.0000 | SUBLINGUAL_TABLET | Freq: Two times a day (BID) | SUBLINGUAL | 0 refills | Status: AC
  Filled 2022-04-02: qty 56, 28d supply, fill #0

## 2022-04-03 ENCOUNTER — Other Ambulatory Visit (HOSPITAL_COMMUNITY): Payer: Self-pay

## 2022-04-04 ENCOUNTER — Other Ambulatory Visit (HOSPITAL_COMMUNITY): Payer: Self-pay
